# Patient Record
Sex: Female | Born: 1990 | Race: Black or African American | Hispanic: No | Marital: Single | Smoking: Former smoker
Health system: Southern US, Community
[De-identification: ages and names within clinical notes are randomized; demographics above are authoritative.]

---

## 2012-10-04 ENCOUNTER — Emergency Department: Payer: Self-pay | Admitting: Emergency Medicine

## 2012-11-16 ENCOUNTER — Emergency Department: Payer: Self-pay | Admitting: Emergency Medicine

## 2013-01-25 ENCOUNTER — Emergency Department: Payer: Self-pay | Admitting: Emergency Medicine

## 2014-11-26 ENCOUNTER — Emergency Department: Payer: Self-pay | Admitting: Emergency Medicine

## 2014-12-05 ENCOUNTER — Ambulatory Visit: Payer: Self-pay | Admitting: Gastroenterology

## 2015-01-19 LAB — SURGICAL PATHOLOGY

## 2016-08-23 IMAGING — CT CT ABD-PELV W/ CM
2 of 4 series · 17 of 46 positions shown, 19 images · IV contrast (omnipaque)
Comparison: None.

CLINICAL DATA: Abdominal pain and diarrhea over the last 3 months,
worsening recently. Central abdominal pain radiating to the back.
Left lower quadrant pain.

EXAM:
CT ABDOMEN AND PELVIS WITH CONTRAST
TECHNIQUE: Multidetector CT imaging of the abdomen and pelvis was performed
using the standard protocol following bolus administration of
intravenous contrast.
CONTRAST:  100 cc Omnipaque 300

[Series 2: routine abd pel with · axial · 0.62mm/px · z∈[-812,-397]mm · 14 of 91 slices shown, 16 images]
[im 4/91  soft-tissue]
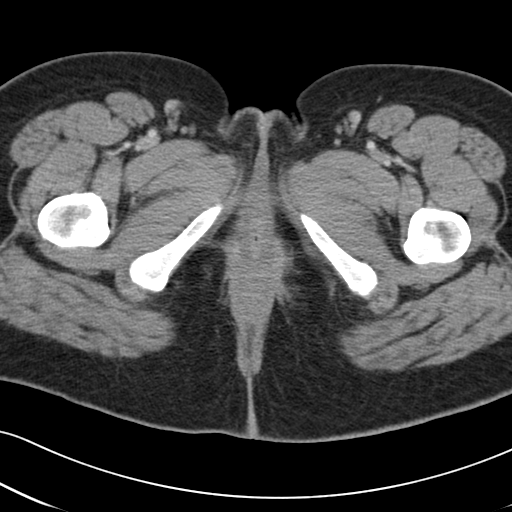
[im 4/91  bone]
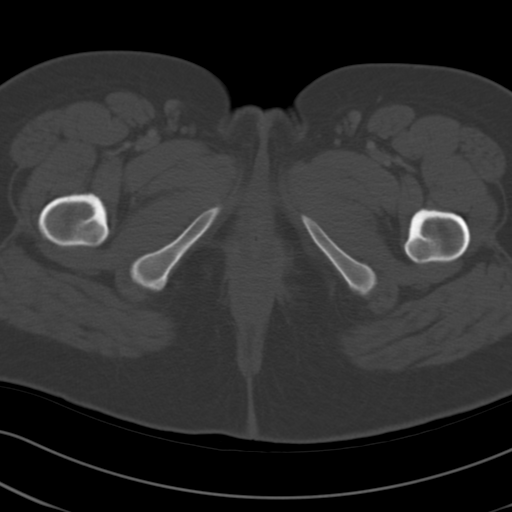
[im 12/91  soft-tissue]
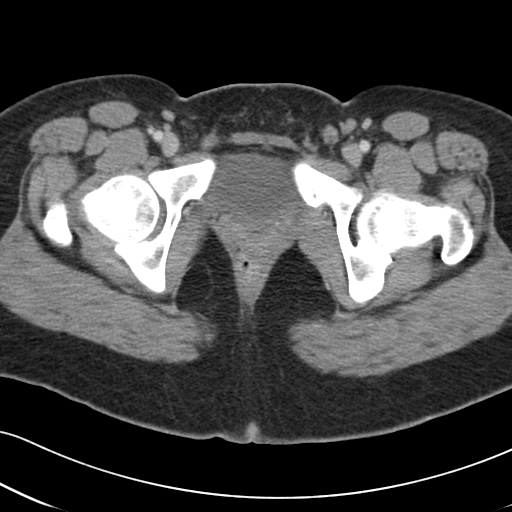
[im 19/91  soft-tissue]
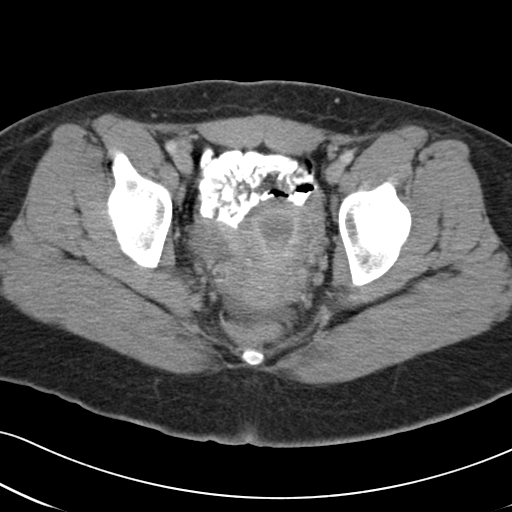
[im 23/91  soft-tissue]
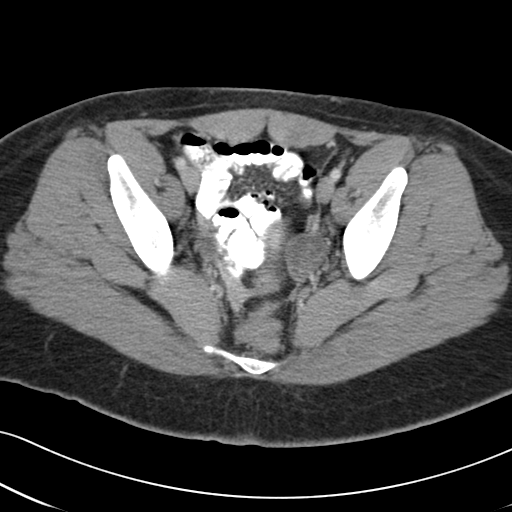
[im 31/91  soft-tissue]
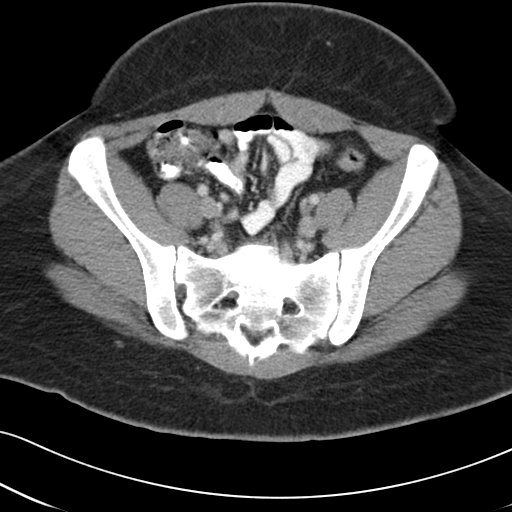
[im 38/91  soft-tissue]
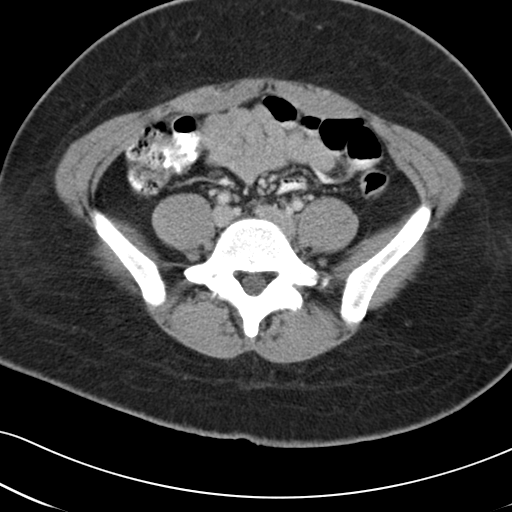
[im 42/91  soft-tissue]
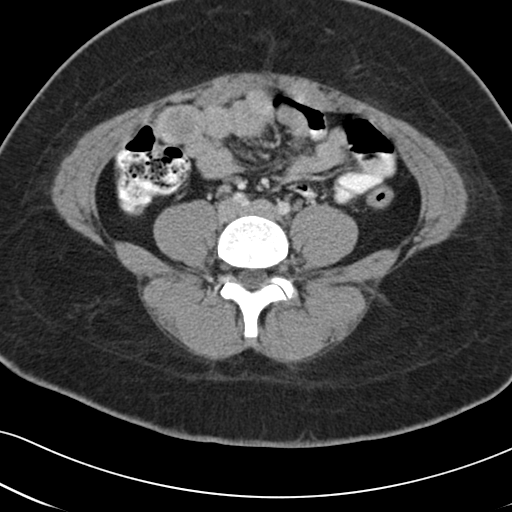
[im 49/91  soft-tissue]
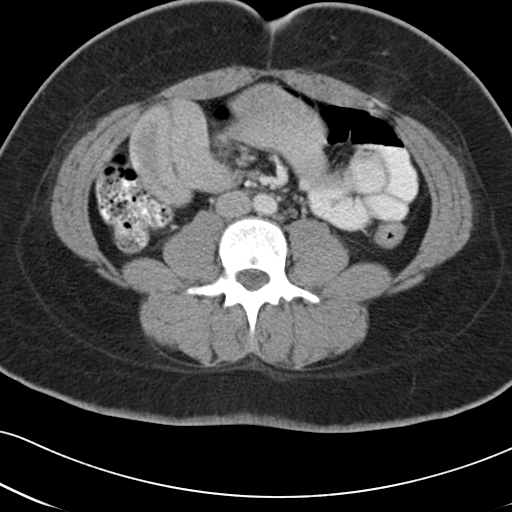
[im 53/91  soft-tissue]
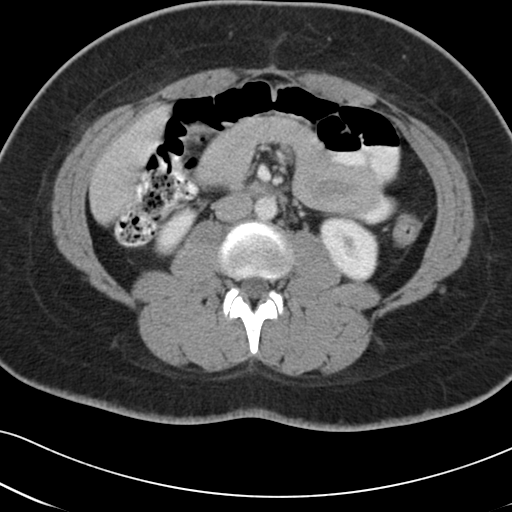
[im 53/91  bone]
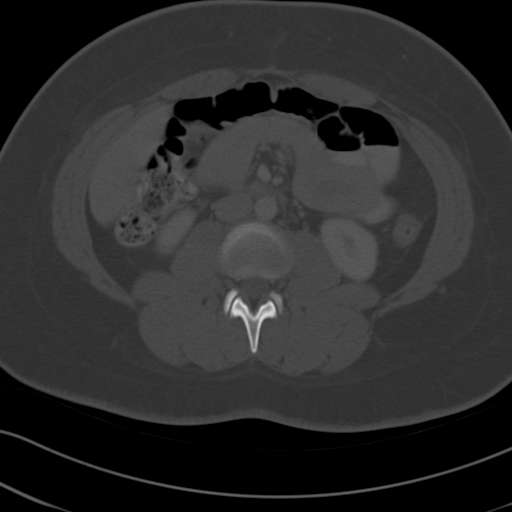
[im 61/91  soft-tissue]
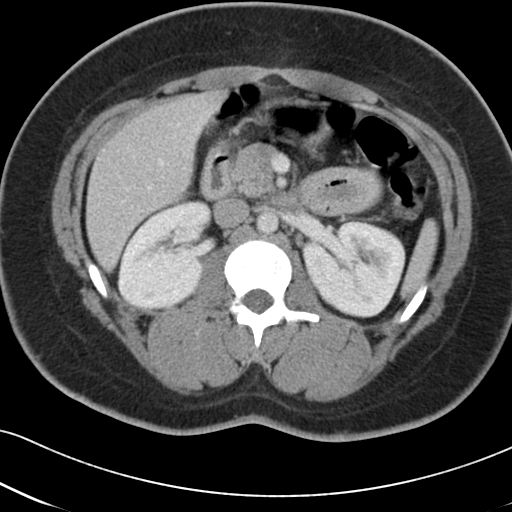
[im 68/91  soft-tissue]
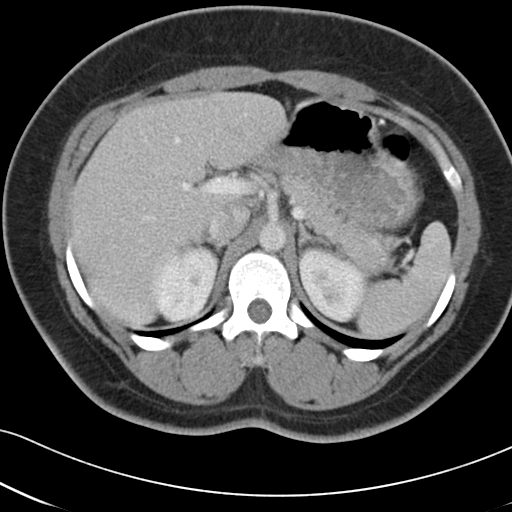
[im 72/91  soft-tissue]
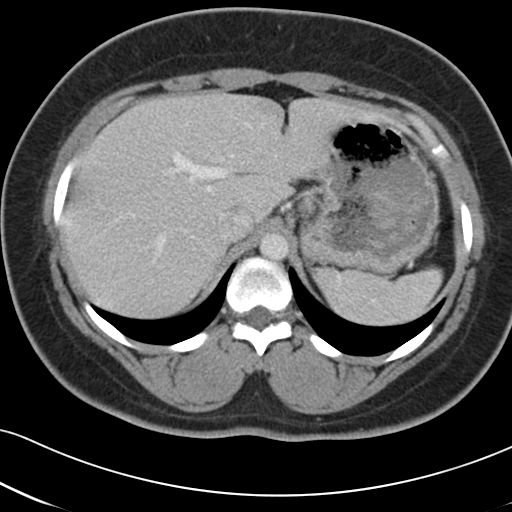
[im 79/91  soft-tissue]
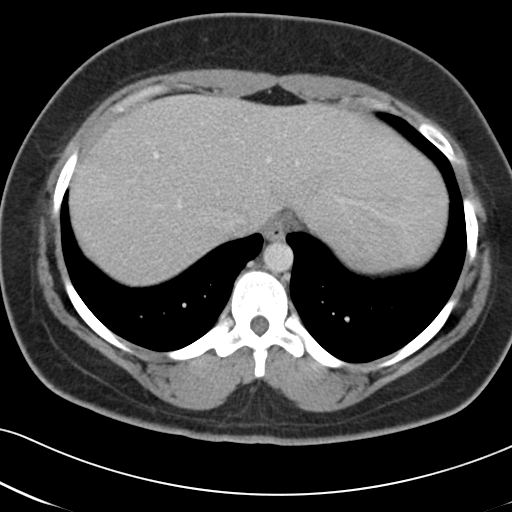
[im 87/91  soft-tissue]
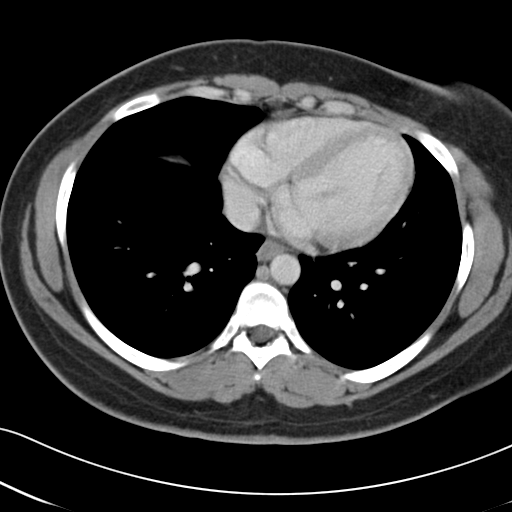

[Series 5: cor routine abd pel with · coronal · 0.89mm/px · 3 of 135 slices shown]
[im 45/135  soft-tissue]
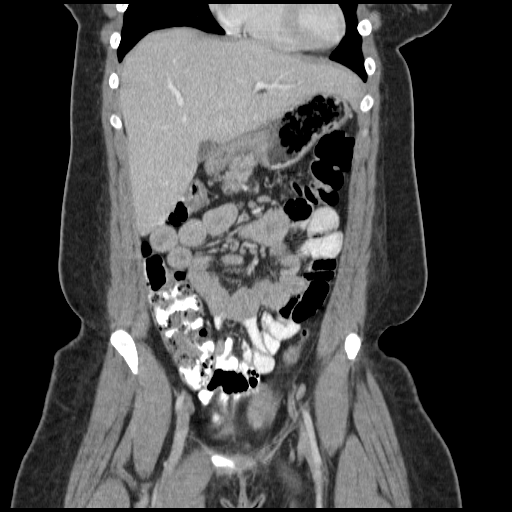
[im 60/135  soft-tissue]
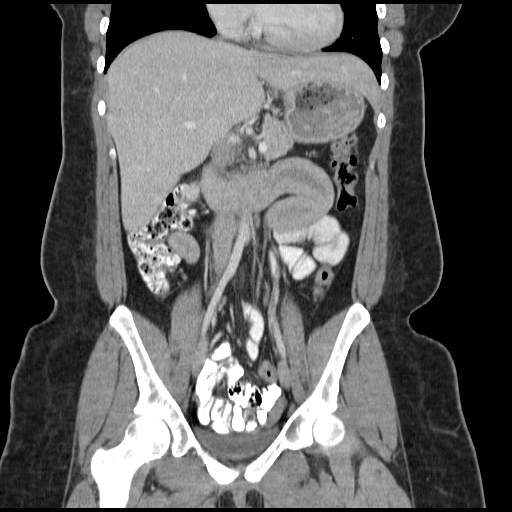
[im 75/135  soft-tissue]
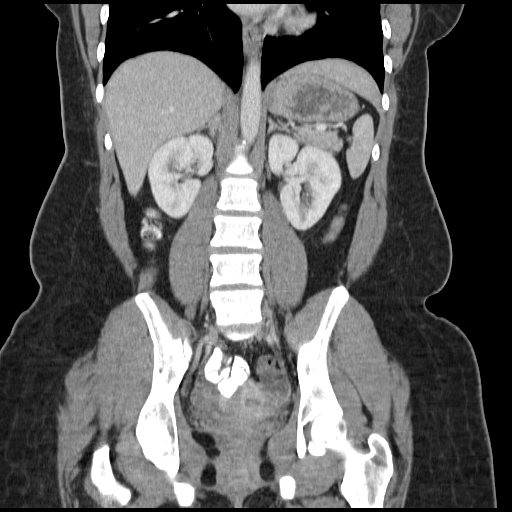

[17 of 46 positions shown; findings below may reference images not displayed]

FINDINGS: Lung bases are clear.  No pleural or pericardial fluid.

The liver has a normal appearance without focal lesions or biliary
ductal dilatation. No calcified gallstones. The spleen is normal.
The pancreas is normal. The adrenal glands are normal. The left
kidney is normal. The right kidney contains a 3 mm stone in the
midportion but there is no evidence of hydronephrosis or passing
stone. The aorta and IVC are normal. Uterus and adnexal regions are
normal. Tiny amount of free fluid, not significant. No osseous
abnormality.

There are abnormal thick walled loops of small intestine, primarily
jejunum. The L a.m. as a more normal appearance. The appendix is
normal. No abnormality seen of the colon. No free air.
IMPRESSION: Multiple loops of thick-walled jejunum. Most likely diagnosis given
this pattern is infectious enteritis. Inflammatory bowel disease
more typically affects the ileum. Celiac disease is a consideration
but felt unlikely.

## 2016-10-24 ENCOUNTER — Encounter: Payer: Self-pay | Admitting: Emergency Medicine

## 2016-10-24 ENCOUNTER — Emergency Department
Admission: EM | Admit: 2016-10-24 | Discharge: 2016-10-24 | Disposition: A | Discharge status: Home / Self Care | Payer: Self-pay | Attending: Emergency Medicine | Admitting: Emergency Medicine

## 2016-10-24 DIAGNOSIS — R300 Dysuria: Secondary | ICD-10-CM | POA: Insufficient documentation

## 2016-10-24 DIAGNOSIS — F17200 Nicotine dependence, unspecified, uncomplicated: Secondary | ICD-10-CM | POA: Insufficient documentation

## 2016-10-24 DIAGNOSIS — R350 Frequency of micturition: Secondary | ICD-10-CM | POA: Insufficient documentation

## 2016-10-24 DIAGNOSIS — Z202 Contact with and (suspected) exposure to infections with a predominantly sexual mode of transmission: Secondary | ICD-10-CM | POA: Insufficient documentation

## 2016-10-24 LAB — CHLAMYDIA/NGC RT PCR (ARMC ONLY)
Chlamydia Tr: NOT DETECTED
N gonorrhoeae: NOT DETECTED

## 2016-10-24 LAB — URINALYSIS, COMPLETE (UACMP) WITH MICROSCOPIC
BILIRUBIN URINE: NEGATIVE
Glucose, UA: NEGATIVE mg/dL
Hgb urine dipstick: NEGATIVE
Ketones, ur: NEGATIVE mg/dL
Leukocytes, UA: NEGATIVE
Nitrite: POSITIVE — AB
Protein, ur: NEGATIVE mg/dL
SPECIFIC GRAVITY, URINE: 1.014 (ref 1.005–1.030)
pH: 8 (ref 5.0–8.0)

## 2016-10-24 LAB — WET PREP, GENITAL
Sperm: NONE SEEN
Trich, Wet Prep: NONE SEEN
Yeast Wet Prep HPF POC: NONE SEEN

## 2016-10-24 LAB — POCT PREGNANCY, URINE: PREG TEST UR: NEGATIVE

## 2016-10-24 MED ORDER — CEFTRIAXONE SODIUM 250 MG IJ SOLR
250.0000 mg | Freq: Once | INTRAMUSCULAR | Status: AC
  Administered 2016-10-24: 250 mg via INTRAMUSCULAR
  Filled 2016-10-24: qty 250

## 2016-10-24 MED ORDER — AZITHROMYCIN 500 MG PO TABS
1000.0000 mg | ORAL_TABLET | Freq: Once | ORAL | Status: AC
  Administered 2016-10-24: 1000 mg via ORAL
  Filled 2016-10-24: qty 2

## 2016-10-24 MED ORDER — CEPHALEXIN 500 MG PO CAPS: 500.0000 mg | ORAL_CAPSULE | Freq: Four times a day (QID) | ORAL | 0 refills | Status: AC

## 2016-10-24 NOTE — ED Notes (Signed)
Urine sent. Yellow and cloudy

## 2016-10-24 NOTE — ED Triage Notes (Signed)
Pt reports had sex for the first time 10/12/16 then started having dysuria and vaginal pain after.  LMP 10/08/16.  Thinks has a UTI. Urine has small odor. Denies vaginal discharge. Pt reports did not use protection but the guy she had sex with told her he was clean (he is here with pt in lobby)".

## 2016-10-24 NOTE — ED Provider Notes (Signed)
[facility] Emergency Department Provider Note  ____________________________________________  Time seen: Approximately 1:13 PM  I have reviewed the triage vital signs and the nursing notes.   HISTORY  Chief Complaint Dysuria and vaginal pain    HPI Rose Stone is a 26 y.o. female presenting to the emergency department with increased urinary frequency and dysuria for the past week. Patient denies hematuria. Patient states that she recently engaged in sexual intercourse for the first time on 10/12/2016. Patient had unprotected sex and is concerned for sexually transmitted diseases. She denies changes in vaginal discharge or vaginal bleeding. She denies flank pain, history of cystitis, history of nephrolithiasis or history of pyelonephritis. She has been afebrile. LMP 10/08/2016. Patient has not attempted alleviating measures.   History reviewed. No pertinent past medical history.  There are no active problems to display for this patient.   History reviewed. No pertinent surgical history.  Prior to Admission medications   Medication Sig Start Date End Date Taking? Authorizing Provider  cephALEXin (KEFLEX) 500 MG capsule Take 1 capsule (500 mg total) by mouth 4 (four) times daily. 10/24/16 11/03/16  Orvil Feil, PA-C    Allergies Patient has no known allergies.  History reviewed. No pertinent family history.  Social History Social History  Substance Use Topics  . Smoking status: Current Every Day Smoker  . Smokeless tobacco: Never Used  . Alcohol use No    Review of Systems  Constitutional: No fever/chills Eyes: No visual changes. No discharge ENT: No upper respiratory complaints. Cardiovascular: no chest pain. Respiratory: no cough. No SOB. Gastrointestinal: No abdominal pain.  No nausea, no vomiting.  No diarrhea.  No constipation. Genitourinary: Patient has had dysuria and increased urinary frequency. No hematuria Musculoskeletal: Negative  for musculoskeletal pain. Skin: Negative for rash, abrasions, lacerations, ecchymosis. Neurological: Negative for headaches, focal weakness or numbness. ____________________________________________   PHYSICAL EXAM:  VITAL SIGNS: ED Triage Vitals  Enc Vitals Group     BP 10/24/16 1202 139/83     Pulse Rate 10/24/16 1202 72     Resp 10/24/16 1202 18     Temp 10/24/16 1202 98.6 F (37 C)     Temp src --      SpO2 10/24/16 1202 100 %     Weight 10/24/16 1200 165 lb (74.8 kg)     Height 10/24/16 1200 5\' 6"  (1.676 m)     Head Circumference --      Peak Flow --      Pain Score 10/24/16 1201 5     Pain Loc --      Pain Edu? --      Excl. in GC? --      Constitutional: Alert and oriented. Well appearing and in no acute distress. Eyes: Conjunctivae are normal. PERRL. EOMI. Head: Atraumatic. Neck: No stridor.  No cervical spine tenderness to palpation. Cardiovascular: Normal rate, regular rhythm. Normal S1 and S2.  Good peripheral circulation. Respiratory: Normal respiratory effort without tachypnea or retractions. Lungs CTAB. Good air entry to the bases with no decreased or absent breath sounds. Gastrointestinal: Bowel sounds 4 quadrants. Soft and nontender to palpation. No guarding or rigidity. No palpable masses. No distention. No CVA tenderness bilaterally.  Genitourinary: The skin overlying the labia majora is without erythema or signs of excoriation. Patient has no cervical motion tenderness with bimanual exam. Musculoskeletal: Full range of motion to all extremities. No gross deformities appreciated. Neurologic:  Normal speech and language. No gross focal neurologic deficits are appreciated.  Skin:  Skin is warm, dry and intact. No rash noted. Psychiatric: Mood and affect are normal. Speech and behavior are normal. Patient exhibits appropriate insight and judgement.    LABS (all labs ordered are listed, but only abnormal results are displayed)  Labs Reviewed  WET PREP,  GENITAL - Abnormal; Notable for the following:       Result Value   Clue Cells Wet Prep HPF POC PRESENT (*)    WBC, Wet Prep HPF POC FEW (*)    All other components within normal limits  URINALYSIS, COMPLETE (UACMP) WITH MICROSCOPIC - Abnormal; Notable for the following:    Color, Urine YELLOW (*)    APPearance CLOUDY (*)    Nitrite POSITIVE (*)    Bacteria, UA RARE (*)    Squamous Epithelial / LPF 0-5 (*)    All other components within normal limits  URINE CULTURE  CHLAMYDIA/NGC RT PCR ([facility] ONLY)  POC URINE PREG, ED  POCT PREGNANCY, URINE   ____________________________________________  EKG   ____________________________________________  RADIOLOGY  No results found.  ____________________________________________    PROCEDURES  Procedure(s) performed:    Procedures    Medications  cefTRIAXone (ROCEPHIN) injection 250 mg (250 mg Intramuscular Given 10/24/16 1409)  azithromycin (ZITHROMAX) tablet 1,000 mg (1,000 mg Oral Given 10/24/16 1408)     ____________________________________________   INITIAL IMPRESSION / ASSESSMENT AND PLAN / ED COURSE  Pertinent labs & imaging results that were available during my care of the patient were reviewed by me and considered in my medical decision making (see chart for details).  Review of the [place] CSRS was performed in accordance of the [facility] prior to dispensing any controlled drugs.     Assessment and Plan: Dysuria:  Patient presents to the emergency department with dysuria and increased urinary frequency. Urinalysis reveals nitrites and rare bacteria. Patient would like to be treated for gonorrhea and Chlamydia in the emergency department. Urine was sent for culture. Ceftriaxone and azithromycin were administered. Patient has been afebrile with no cervical motion tenderness elicited on pelvic exam. Patient was treated empirically with Keflex for suspected urinary tract infection. Patient education was provided  regarding safe sex practices. All patient questions were answered. ___________________________________________  FINAL CLINICAL IMPRESSION(S) / ED DIAGNOSES  Final diagnoses:  STD exposure  Dysuria  Increased urinary frequency      NEW MEDICATIONS STARTED DURING THIS VISIT:  Discharge Medication List as of 10/24/2016  1:52 PM    START taking these medications   Details  cephALEXin (KEFLEX) 500 MG capsule Take 1 capsule (500 mg total) by mouth 4 (four) times daily., Starting Mon 10/24/2016, Until Thu 11/03/2016, Print            This chart was dictated using voice recognition software/Dragon. Despite best efforts to proofread, errors can occur which can change the meaning. Any change was purely unintentional.    Orvil FeilJaclyn M Woods, PA-C 10/24/16 1558    Minna AntisKevin Paduchowski, MD 10/27/16 [phone]

## 2016-10-27 LAB — URINE CULTURE
Culture: >=100000 — AB
Special Requests: NORMAL

## 2016-10-28 NOTE — Progress Notes (Signed)
ED Antimicrobial Stewardship Positive Culture Follow Up   Rose Stone is an 26 y.o. female who [facility] with a chief complaint of  Chief Complaint  Patient presents with  . Dysuria  . vaginal pain    Recent Results (from the past 720 hour(s))  Urine culture     Status: Abnormal   Collection Time: 10/24/16  1:47 PM  Result Value Ref Range Status   Specimen Description URINE, CLEAN CATCH  Final   Special Requests Normal  Final   Culture >=100,000 COLONIES/mL ESCHERICHIA COLI (A)  Final   Report Status 10/27/2016 FINAL  Final   Organism ID, Bacteria ESCHERICHIA COLI (A)  Final      Susceptibility   Escherichia coli - MIC*    AMPICILLIN 4 SENSITIVE Sensitive     CEFAZOLIN <=4 SENSITIVE Sensitive     CEFTRIAXONE <=1 SENSITIVE Sensitive     CIPROFLOXACIN <=0.25 SENSITIVE Sensitive     GENTAMICIN <=1 SENSITIVE Sensitive     IMIPENEM <=0.25 SENSITIVE Sensitive     NITROFURANTOIN <=16 SENSITIVE Sensitive     TRIMETH/SULFA >=320 RESISTANT Resistant     AMPICILLIN/SULBACTAM <=2 SENSITIVE Sensitive     PIP/TAZO <=4 SENSITIVE Sensitive     Extended ESBL NEGATIVE Sensitive     * >=100,000 COLONIES/mL ESCHERICHIA COLI  Chlamydia/NGC rt PCR ([facility] only)     Status: None   Collection Time: 10/24/16  1:47 PM  Result Value Ref Range Status   Specimen source GC/Chlam ENDOCERVICAL  Final   Chlamydia Tr NOT DETECTED NOT DETECTED Final   N gonorrhoeae NOT DETECTED NOT DETECTED Final    Comment: (NOTE) 100  This methodology has not been evaluated in pregnant women or in 200  patients with a history of hysterectomy. 300 400  This methodology will not be performed on patients less than 6414  years of age.   Wet prep, genital     Status: Abnormal   Collection Time: 10/24/16  1:47 PM  Result Value Ref Range Status   Yeast Wet Prep HPF POC NONE SEEN NONE SEEN Final   Trich, Wet Prep NONE SEEN NONE SEEN Final   Clue Cells Wet Prep HPF POC PRESENT (A) NONE SEEN Final   WBC, Wet Prep HPF POC FEW (A) NONE SEEN Final   Sperm NONE SEEN  Final   Treated with appropriate antibiotic. Sent home on cephalexin.  Delsa BernKelly m Fuhrmann, PharmD 10/28/2016, 8:14 PM

## 2016-10-31 ENCOUNTER — Emergency Department
Admission: EM | Admit: 2016-10-31 | Discharge: 2016-10-31 | Disposition: A | Discharge status: Home / Self Care | Payer: Self-pay | Attending: Emergency Medicine | Admitting: Emergency Medicine

## 2016-10-31 ENCOUNTER — Encounter: Payer: Self-pay | Admitting: Emergency Medicine

## 2016-10-31 DIAGNOSIS — Z792 Long term (current) use of antibiotics: Secondary | ICD-10-CM | POA: Insufficient documentation

## 2016-10-31 DIAGNOSIS — F17200 Nicotine dependence, unspecified, uncomplicated: Secondary | ICD-10-CM | POA: Insufficient documentation

## 2016-10-31 DIAGNOSIS — R112 Nausea with vomiting, unspecified: Secondary | ICD-10-CM

## 2016-10-31 DIAGNOSIS — K529 Noninfective gastroenteritis and colitis, unspecified: Secondary | ICD-10-CM | POA: Insufficient documentation

## 2016-10-31 DIAGNOSIS — R197 Diarrhea, unspecified: Secondary | ICD-10-CM

## 2016-10-31 DIAGNOSIS — F1290 Cannabis use, unspecified, uncomplicated: Secondary | ICD-10-CM | POA: Insufficient documentation

## 2016-10-31 LAB — COMPREHENSIVE METABOLIC PANEL
ALBUMIN: 4.3 g/dL (ref 3.5–5.0)
ALT: 12 U/L — ABNORMAL LOW (ref 14–54)
AST: 19 U/L (ref 15–41)
Alkaline Phosphatase: 60 U/L (ref 38–126)
Anion gap: 7 (ref 5–15)
BUN: 11 mg/dL (ref 6–20)
CHLORIDE: 107 mmol/L (ref 101–111)
CO2: 24 mmol/L (ref 22–32)
Calcium: 9.4 mg/dL (ref 8.9–10.3)
Creatinine, Ser: 0.62 mg/dL (ref 0.44–1.00)
GFR calc Af Amer: >60 mL/min (ref >60)
GFR calc non Af Amer: >60 mL/min (ref >60)
GLUCOSE: 107 mg/dL — AB (ref 65–99)
POTASSIUM: 3.3 mmol/L — AB (ref 3.5–5.1)
SODIUM: 138 mmol/L (ref 135–145)
Total Bilirubin: 0.7 mg/dL (ref 0.3–1.2)
Total Protein: 7.6 g/dL (ref 6.5–8.1)

## 2016-10-31 LAB — CBC
HEMATOCRIT: 34.7 % — AB (ref 35.0–47.0)
Hemoglobin: 11.5 g/dL — ABNORMAL LOW (ref 12.0–16.0)
MCH: 28.4 pg (ref 26.0–34.0)
MCHC: 33.1 g/dL (ref 32.0–36.0)
MCV: 85.8 fL (ref 80.0–100.0)
Platelets: 331 10*3/uL (ref 150–440)
RBC: 4.04 MIL/uL (ref 3.80–5.20)
RDW: 14 % (ref 11.5–14.5)
WBC: 7.1 10*3/uL (ref 3.6–11.0)

## 2016-10-31 LAB — URINALYSIS, COMPLETE (UACMP) WITH MICROSCOPIC
BACTERIA UA: NONE SEEN
BILIRUBIN URINE: NEGATIVE
Glucose, UA: NEGATIVE mg/dL
Hgb urine dipstick: NEGATIVE
Ketones, ur: NEGATIVE mg/dL
LEUKOCYTES UA: NEGATIVE
Nitrite: NEGATIVE
PROTEIN: NEGATIVE mg/dL
SPECIFIC GRAVITY, URINE: 1.014 (ref 1.005–1.030)
pH: 6 (ref 5.0–8.0)

## 2016-10-31 LAB — POCT PREGNANCY, URINE: PREG TEST UR: NEGATIVE

## 2016-10-31 LAB — LIPASE, BLOOD: LIPASE: 25 U/L (ref 11–51)

## 2016-10-31 MED ORDER — LOPERAMIDE HCL 2 MG PO CAPS
2.0000 mg | ORAL_CAPSULE | Freq: Once | ORAL | Status: AC
  Administered 2016-10-31: 2 mg via ORAL

## 2016-10-31 MED ORDER — ONDANSETRON HCL 4 MG/2ML IJ SOLN: 4.0000 mg | Freq: Once | INTRAMUSCULAR | Status: DC

## 2016-10-31 MED ORDER — ONDANSETRON 4 MG PO TBDP: 4.0000 mg | ORAL_TABLET | Freq: Four times a day (QID) | ORAL | 0 refills | Status: DC | PRN

## 2016-10-31 MED ORDER — ONDANSETRON 4 MG PO TBDP
4.0000 mg | ORAL_TABLET | Freq: Once | ORAL | Status: AC
  Administered 2016-10-31: 4 mg via ORAL

## 2016-10-31 NOTE — ED Provider Notes (Signed)
[facility] Emergency Department Provider Note   ____________________________________________   First MD Initiated Contact with Patient 10/31/16 2221     (approximate)  I have reviewed the triage vital signs and the nursing notes.   HISTORY  Chief Complaint Diarrhea    HPI Rose Stone is a 26 y.o. female ports having started with nausea and vomiting for the last 3 days, feels like she has developed "stomach virus". Nausea with vomiting which she describes as food and water which has primarily gone away with some just mild nausea, but over the last day and a half is also had several loose stools. The last loose stool was about 2:00 this afternoon. No bloody stool. Reports no abdominal pain, but has had some slight crampy discomfort especially over the left side of the abdomen before having diarrhea.  Has missed work the last couple of days. Has not tried any medications, but reports is now able to eat and drink well. Denies pregnancy. No pain or burning with urination. No vaginal discharge. No fevers, no right-sided abdominal pain.   History reviewed. No pertinent past medical history.  There are no active problems to display for this patient.   History reviewed. No pertinent surgical history.  Prior to Admission medications   Medication Sig Start Date End Date Taking? Authorizing Provider  cephALEXin (KEFLEX) 500 MG capsule Take 1 capsule (500 mg total) by mouth 4 (four) times daily. 10/24/16 11/03/16  Orvil Feil, PA-C  ondansetron (ZOFRAN ODT) 4 MG disintegrating tablet Take 1 tablet (4 mg total) by mouth every 6 (six) hours as needed for nausea or vomiting. 10/31/16   Sharyn Creamer, MD    Allergies Patient has no known allergies.  No family history on file.  Social History Social History  Substance Use Topics  . Smoking status: Current Every Day Smoker  . Smokeless tobacco: Never Used  . Alcohol use No    Review of Systems Constitutional:  No fever/chills Eyes: No visual changes. ENT: No sore throat. Cardiovascular: Denies chest pain. Respiratory: Denies shortness of breath. Gastrointestinal: No abdominal pain.    No constipation. Genitourinary: Negative for dysuria. Denies pregnancy. Vaginal bleeding or discharge. No lower abdominal pain, in particular no right lower abdominal pain Musculoskeletal: Negative for back pain. Skin: Negative for rash. Neurological: Negative for headaches, focal weakness or numbness.  10-point ROS otherwise negative.  ____________________________________________   PHYSICAL EXAM:  VITAL SIGNS: ED Triage Vitals  Enc Vitals Group     BP 10/31/16 1932 126/67     Pulse Rate 10/31/16 1932 81     Resp 10/31/16 1932 18     Temp 10/31/16 1932 98.5 F (36.9 C)     Temp Source 10/31/16 1932 Oral     SpO2 10/31/16 1932 100 %     Weight 10/31/16 1934 160 lb (72.6 kg)     Height 10/31/16 1934 5\' 6"  (1.676 m)     Head Circumference --      Peak Flow --      Pain Score 10/31/16 1935 5     Pain Loc --      Pain Edu? --      Excl. in GC? --     Constitutional: Alert and oriented. Well appearing and in no acute distress.Very pleasant. Eyes: Conjunctivae are normal. PERRL. EOMI. Head: Atraumatic. Nose: No congestion/rhinnorhea. Mouth/Throat: Mucous membranes are moist.  Oropharynx non-erythematous. Neck: No stridor.   Cardiovascular: Normal rate, regular rhythm. Grossly normal heart sounds.  Good  peripheral circulation. Respiratory: Normal respiratory effort.  No retractions. Lungs CTAB. Gastrointestinal: Soft and nontender except for some minimal discomfort in the left upper quadrant palpation without rebound or guarding. Negative Murphy. No pain at McBurney's point. No distention. No abdominal bruits. Neg [place]. Musculoskeletal: No lower extremity tenderness nor edema.   Neurologic:  Normal speech and language. No gross focal neurologic deficits are appreciated. No gait instability. Skin:   Skin is warm, dry and intact. No rash noted. Psychiatric: Mood and affect are normal. Speech and behavior are normal.  ____________________________________________   LABS (all labs ordered are listed, but only abnormal results are displayed)  Labs Reviewed  COMPREHENSIVE METABOLIC PANEL - Abnormal; Notable for the following:       Result Value   Potassium 3.3 (*)    Glucose, Bld 107 (*)    ALT 12 (*)    All other components within normal limits  CBC - Abnormal; Notable for the following:    Hemoglobin 11.5 (*)    HCT 34.7 (*)    All other components within normal limits  URINALYSIS, COMPLETE (UACMP) WITH MICROSCOPIC - Abnormal; Notable for the following:    Color, Urine YELLOW (*)    APPearance CLEAR (*)    Squamous Epithelial / LPF 0-5 (*)    All other components within normal limits  LIPASE, BLOOD  POCT PREGNANCY, URINE   ____________________________________________  EKG   ____________________________________________  RADIOLOGY   ____________________________________________   PROCEDURES  Procedure(s) performed: None  Procedures  Critical Care performed: No  ____________________________________________   INITIAL IMPRESSION / ASSESSMENT AND PLAN / ED COURSE  Pertinent labs & imaging results that were available during my care of the patient were reviewed by me and considered in my medical decision making (see chart for details).  Patient is from evaluation of nausea vomiting diarrhea. Hemodynamically stable, reports her vomiting has improved and she is able take by mouth well. She is afebrile, reassuring lab work, and very reassuring clinical examination. I discussed with the patient, and believe the best option of treatment here is with antiemetic, and over-the-counter Imodium. She appears to have a likely viral self-limited illness, suspicious for gastroenteritis or nor a virus given Season. No influenza symptoms noted.  No evidence of an acute abdomen, no  pain to palpation in the right upper quadrant with reassuring LFTs. No leukocytosis, I find it extremely unlikely that she is suffering are developed appendicitis given her clinical examination and history. I discussed with the patient very careful return precautions, and she is agreeable to following up, and returning if worsening or persistent symptoms, developing a fever or other new concerns arise.  Return precautions and treatment recommendations and follow-up discussed with the patient who is agreeable with the plan.       ____________________________________________   FINAL CLINICAL IMPRESSION(S) / ED DIAGNOSES  Final diagnoses:  Nausea vomiting and diarrhea  Gastroenteritis      NEW MEDICATIONS STARTED DURING THIS VISIT:  New Prescriptions   ONDANSETRON (ZOFRAN ODT) 4 MG DISINTEGRATING TABLET    Take 1 tablet (4 mg total) by mouth every 6 (six) hours as needed for nausea or vomiting.     Note:  This document was prepared using Dragon voice recognition software and may include unintentional dictation errors.     Sharyn CreamerMark Danali Marinos, MD 10/31/16 [phone]

## 2016-10-31 NOTE — Discharge Instructions (Signed)
You were seen in the emergency room for vomiting and diarrhea. I suspect this is from a viral illness. It is important that you follow up closely with your primary care doctor in the next couple of days.  I would also suggest trying Imodium over-the-counter as directed on the packaging for the next couple days. This may help with your diarrhea.   Please return to the emergency room right away if you are to develop a fever, severe nausea, your pain becomes severe or worsens, you are unable to keep food down, begin vomiting any dark or bloody fluid, you develop any dark or bloody stools, feel dehydrated, or other new concerns or symptoms arise.

## 2016-10-31 NOTE — ED Triage Notes (Signed)
Pt ambulatory to triage with steady gait with c/o diarrhea x 3 days, pt states had vomiting episodes on Saturday and Sunday but reports today vomiting has subsided. Pt denies running fever at home, urinary symptoms, or chest pain. Pt also c/o mid lower abd pain. Pt alert and oriented x 4, respirations even and unlabored.

## 2016-10-31 NOTE — ED Notes (Signed)
Pt reports nausea and vomiting on Saturday and one episode of vomiting Sunday, pt reports since Saturday Diarrhea pt reports today she had 6 episodes of diarrhea denies any nausea or vomiting today. Pt reports only one sick at home.

## 2016-10-31 NOTE — ED Notes (Signed)
Pt verbalizes understanding of discharge instructions.

## 2016-10-31 NOTE — ED Notes (Signed)
Pt called X 3 times in lobby, no answer.

## 2016-10-31 NOTE — ED Notes (Signed)
Pt reports she has been eating light bland diet.

## 2019-04-22 ENCOUNTER — Emergency Department
Admission: EM | Admit: 2019-04-22 | Discharge: 2019-04-22 | Disposition: A | Discharge status: Home / Self Care | Payer: Self-pay | Attending: Emergency Medicine | Admitting: Emergency Medicine

## 2019-04-22 ENCOUNTER — Other Ambulatory Visit: Payer: Self-pay

## 2019-04-22 ENCOUNTER — Emergency Department: Payer: Self-pay

## 2019-04-22 DIAGNOSIS — Y9383 Activity, rough housing and horseplay: Secondary | ICD-10-CM | POA: Insufficient documentation

## 2019-04-22 DIAGNOSIS — S93601A Unspecified sprain of right foot, initial encounter: Secondary | ICD-10-CM | POA: Insufficient documentation

## 2019-04-22 DIAGNOSIS — X501XXA Overexertion from prolonged static or awkward postures, initial encounter: Secondary | ICD-10-CM | POA: Insufficient documentation

## 2019-04-22 DIAGNOSIS — Y929 Unspecified place or not applicable: Secondary | ICD-10-CM | POA: Insufficient documentation

## 2019-04-22 DIAGNOSIS — F17210 Nicotine dependence, cigarettes, uncomplicated: Secondary | ICD-10-CM | POA: Insufficient documentation

## 2019-04-22 DIAGNOSIS — Y999 Unspecified external cause status: Secondary | ICD-10-CM | POA: Insufficient documentation

## 2019-04-22 MED ORDER — NAPROXEN 500 MG PO TABS: 500.0000 mg | ORAL_TABLET | Freq: Two times a day (BID) | ORAL | 0 refills | Status: DC

## 2019-04-22 NOTE — ED Provider Notes (Signed)
[facility] Emergency Department Provider Note ____________________________________________  Time seen: Approximately 5:45 PM  I have reviewed the triage vital signs and the nursing notes.   HISTORY  Chief Complaint Foot Injury    HPI Rose Stone is a 28 y.o. 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 They fell to the ground and her foot got twisted. No pain with weight bearing, but does have pain when walking.  History reviewed. No pertinent past medical history.  There are no active problems to display for this patient.   History reviewed. No pertinent surgical history.  Prior to Admission medications   Medication Sig Start Date End Date Taking? Authorizing Provider  naproxen (NAPROSYN) 500 MG tablet Take 1 tablet (500 mg total) by mouth 2 (two) times daily with a meal. 04/22/19   Mikela Senn B, FNP    Allergies Patient has no known allergies.  No family history on file.  Social History Social History   Tobacco Use  . Smoking status: Current Every Day Smoker  . Smokeless tobacco: Never Used  Substance Use Topics  . Alcohol use: Yes  . Drug use: Yes    Types: Marijuana    Review of Systems Constitutional: Negative for fever. Cardiovascular: Negative for chest pain. Respiratory: Negative for shortness of breath. Musculoskeletal: Positive for foot pain. Skin: Negative for open wound or lesion.  Neurological: Negative for decrease in sensation  ____________________________________________   PHYSICAL EXAM:  VITAL SIGNS: ED Triage Vitals  Enc Vitals Group     BP 04/22/19 1645 120/69     Pulse Rate 04/22/19 1645 72     Resp 04/22/19 1645 18     Temp 04/22/19 1645 98.3 F (36.8 C)     Temp Source 04/22/19 1645 Oral     SpO2 04/22/19 1645 97 %     Weight 04/22/19 1646 153 lb (69.4 kg)     Height 04/22/19 1646 5'  6" (1.676 m)     Head Circumference --      Peak Flow --      Pain Score 04/22/19 1646 5     Pain Loc --      Pain Edu? --      Excl. in [place]? --     Constitutional: Alert and oriented. Well appearing and in no acute distress. Eyes: Conjunctivae are clear without discharge or drainage Head: Atraumatic Neck: Supple Respiratory: No cough. Respirations are even and unlabored. Musculoskeletal: Focal tenderness over the proximal 4th and 5th metatarsals with mild swelling. Neurologic: Motor and sensory function is intact.  Skin: No open wound or lesions. No contusions.  Psychiatric: Affect and behavior are appropriate.  ____________________________________________   LABS (all labs ordered are listed, but only abnormal results are displayed)  Labs Reviewed - No data to display ____________________________________________  RADIOLOGY  Normal right foot. ____________________________________________   PROCEDURES  Procedures  ____________________________________________   INITIAL IMPRESSION / ASSESSMENT AND PLAN / ED COURSE  Rose Stone is a 28 y.o. who presents to the emergency department for evaluation of right foot pain after injury last night.  X-ray is reassuring. ACE bandage applied and naprosyn sent to her pharmacy. She is to follow up with PCP if not improving over the week.  Medications - No data to display  Pertinent labs & imaging results that were available during my care of the patient were reviewed by me and considered in  my medical decision making (see chart for details).  _________________________________________   FINAL CLINICAL IMPRESSION(S) / ED DIAGNOSES  Final diagnoses:  Sprain of right foot, initial encounter    ED Discharge Orders         Ordered    naproxen (NAPROSYN) 500 MG tablet  2 times daily with meals     04/22/19 1743           If controlled substance prescribed during this visit, 12 month history viewed on the [place] prior to  issuing an initial prescription for Schedule II or III opiod.   Chinita Pesterriplett, Shevaun Lovan B, FNP 04/22/19 1749    Concha SeFunke, Mary E, MD 04/22/19 2035

## 2019-04-22 NOTE — Discharge Instructions (Signed)
Follow up with primary care provider if not improving over the next few days.  Return to the ER for symptoms that change or worsen if unable to schedule an appointment.

## 2019-04-22 NOTE — ED Triage Notes (Signed)
Pt states she injured her right foot last night horse playing with her brother.

## 2019-04-22 NOTE — ED Notes (Signed)
See triage note states she was playing around with her brother  He landed on right foot   Having pain to top of foot Ambulatory to room with slight limp

## 2020-09-23 ENCOUNTER — Encounter: Payer: Self-pay | Admitting: Obstetrics & Gynecology

## 2021-01-17 IMAGING — DX RIGHT FOOT COMPLETE - 3+ VIEW
3 series · 3 of 3 positions shown · non-contrast
Comparison: None.

CLINICAL DATA: Lateral foot pain today.  Twisting injury.

EXAM:
RIGHT FOOT COMPLETE - 3+ VIEW

[foot ap]
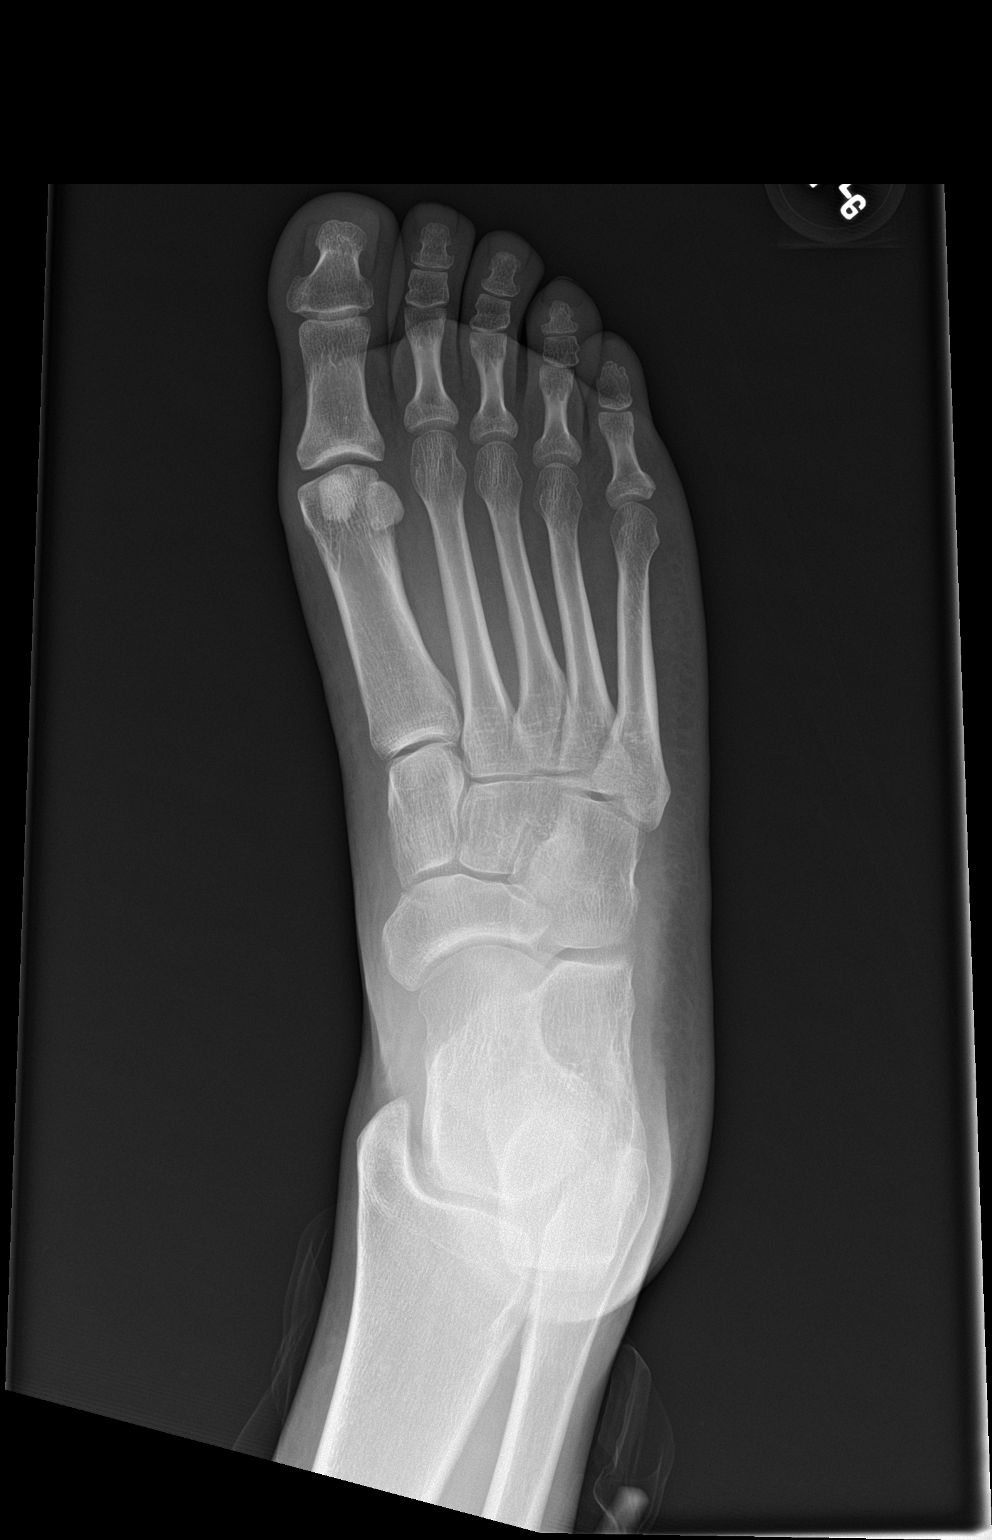

[foot obl]
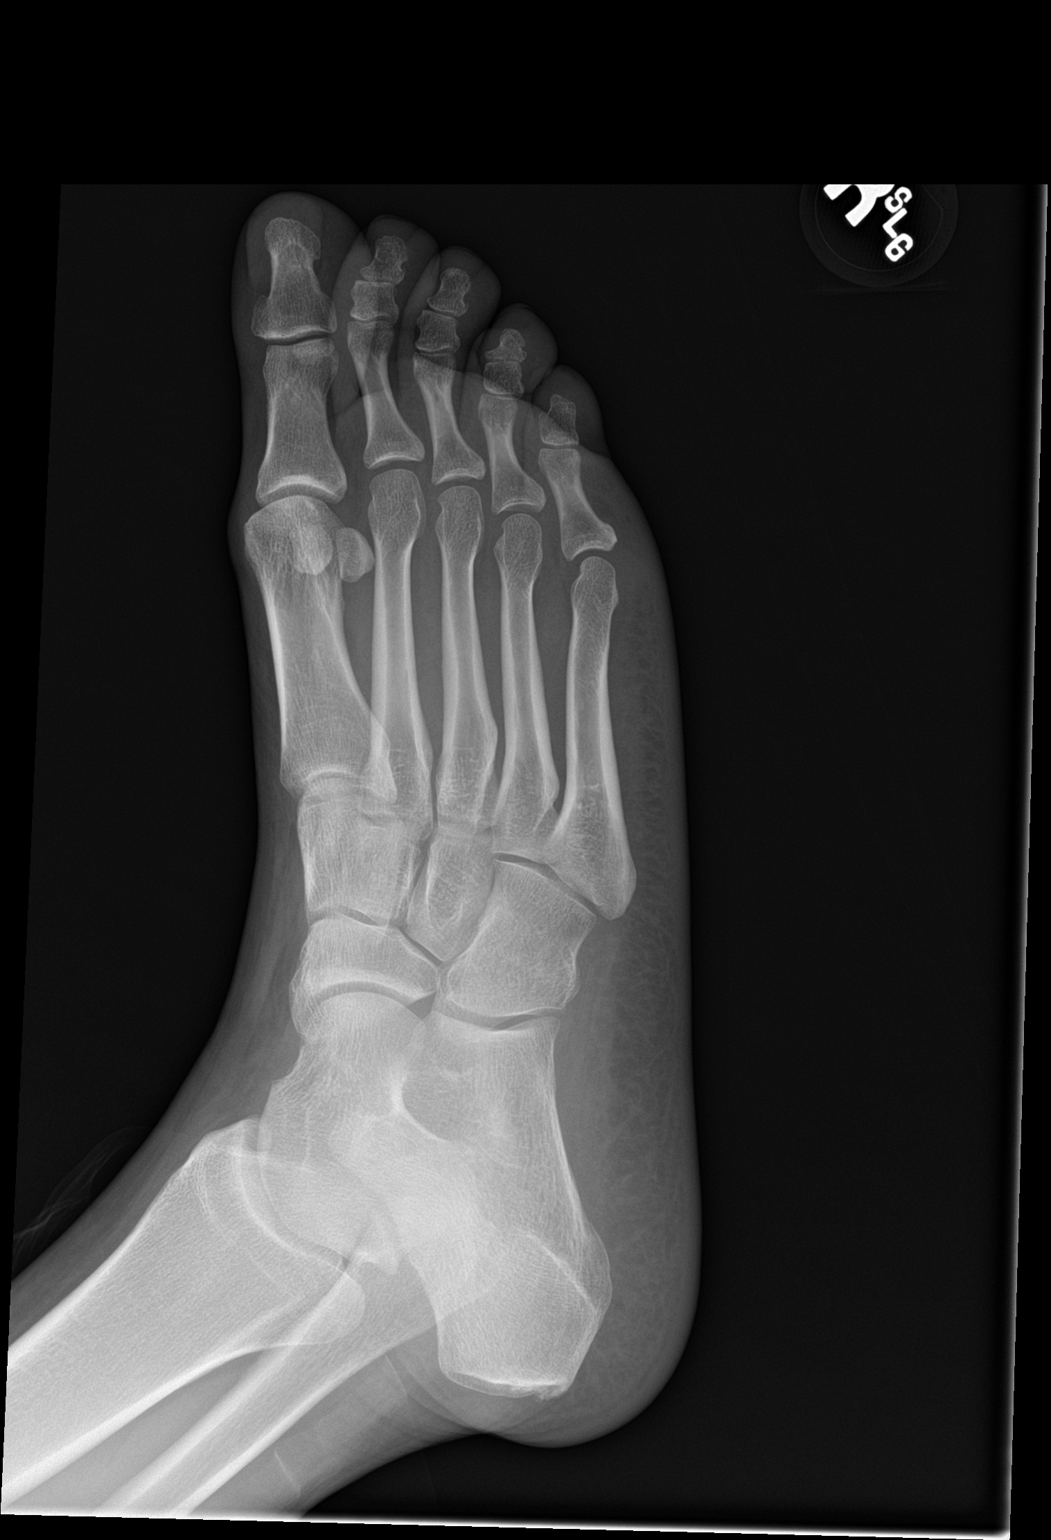

[foot lat]
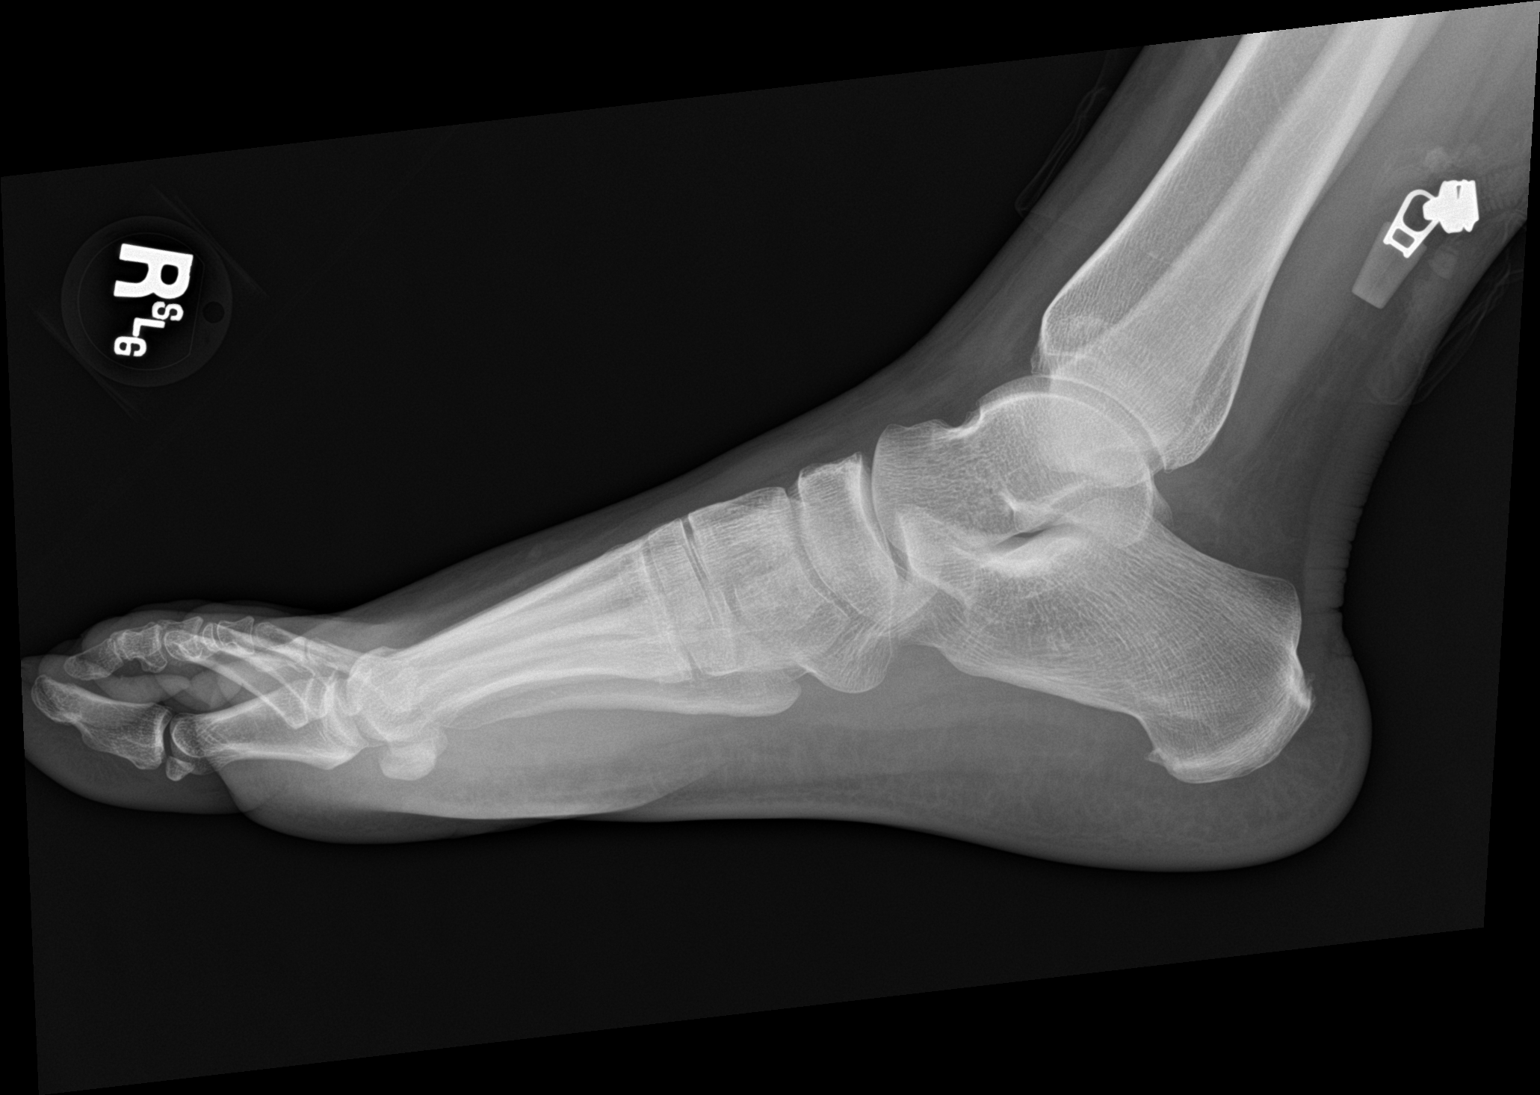

[3 of 3 positions shown; findings below may reference images not displayed]

FINDINGS: The mineralization and alignment are normal. There is no evidence of
acute fracture or dislocation. The joint spaces are preserved. No
focal soft tissue swelling or foreign bodies demonstrated.
IMPRESSION: Normal right foot radiographs.

## 2021-02-15 ENCOUNTER — Encounter: Payer: Self-pay | Admitting: Certified Nurse Midwife

## 2021-02-16 ENCOUNTER — Encounter: Payer: Self-pay | Admitting: Certified Nurse Midwife

## 2021-02-18 ENCOUNTER — Encounter: Payer: Self-pay | Admitting: Obstetrics and Gynecology

## 2021-03-17 ENCOUNTER — Encounter: Payer: Self-pay | Admitting: Obstetrics and Gynecology

## 2022-04-04 ENCOUNTER — Other Ambulatory Visit: Payer: Self-pay | Admitting: Obstetrics and Gynecology

## 2022-04-04 DIAGNOSIS — Z3141 Encounter for fertility testing: Secondary | ICD-10-CM

## 2022-04-12 ENCOUNTER — Other Ambulatory Visit: Payer: Self-pay | Admitting: Obstetrics and Gynecology

## 2022-04-12 DIAGNOSIS — Z3141 Encounter for fertility testing: Secondary | ICD-10-CM

## 2022-04-13 ENCOUNTER — Ambulatory Visit: Admission: RE | Admit: 2022-04-13 | Payer: BC Managed Care – PPO | Source: Ambulatory Visit

## 2022-09-12 ENCOUNTER — Ambulatory Visit
Admission: RE | Admit: 2022-09-12 | Discharge: 2022-09-12 | Disposition: A | Discharge status: Home / Self Care | Payer: BC Managed Care – PPO | Source: Ambulatory Visit | Attending: Obstetrics and Gynecology | Admitting: Obstetrics and Gynecology

## 2022-09-12 DIAGNOSIS — Z3141 Encounter for fertility testing: Secondary | ICD-10-CM

## 2023-09-05 ENCOUNTER — Emergency Department
Admission: EM | Admit: 2023-09-05 | Discharge: 2023-09-05 | Disposition: A | Discharge status: Home / Self Care | Payer: BC Managed Care – PPO | Attending: Emergency Medicine | Admitting: Emergency Medicine

## 2023-09-05 ENCOUNTER — Emergency Department: Payer: BC Managed Care – PPO

## 2023-09-05 ENCOUNTER — Other Ambulatory Visit: Payer: Self-pay

## 2023-09-05 DIAGNOSIS — M546 Pain in thoracic spine: Secondary | ICD-10-CM | POA: Diagnosis present

## 2023-09-05 DIAGNOSIS — R0602 Shortness of breath: Secondary | ICD-10-CM

## 2023-09-05 DIAGNOSIS — M62830 Muscle spasm of back: Secondary | ICD-10-CM | POA: Diagnosis not present

## 2023-09-05 MED ORDER — ACETAMINOPHEN 325 MG PO TABS
650.0000 mg | ORAL_TABLET | Freq: Once | ORAL | Status: AC
  Administered 2023-09-05: 650 mg via ORAL
  Filled 2023-09-05: qty 2

## 2023-09-05 MED ORDER — LIDOCAINE 5 % EX PTCH
1.0000 | MEDICATED_PATCH | CUTANEOUS | Status: DC
  Administered 2023-09-05: 1 via TRANSDERMAL
  Filled 2023-09-05: qty 1

## 2023-09-05 MED ORDER — CYCLOBENZAPRINE HCL 5 MG PO TABS: 5.0000 mg | ORAL_TABLET | Freq: Three times a day (TID) | ORAL | 0 refills | Status: DC | PRN

## 2023-09-05 MED ORDER — CYCLOBENZAPRINE HCL 10 MG PO TABS
5.0000 mg | ORAL_TABLET | Freq: Once | ORAL | Status: AC
  Administered 2023-09-05: 5 mg via ORAL
  Filled 2023-09-05: qty 1

## 2023-09-05 MED ORDER — KETOROLAC TROMETHAMINE 30 MG/ML IJ SOLN
30.0000 mg | Freq: Once | INTRAMUSCULAR | Status: AC
  Administered 2023-09-05: 30 mg via INTRAMUSCULAR
  Filled 2023-09-05: qty 1

## 2023-09-05 NOTE — Discharge Instructions (Addendum)
Your evaluated in the ED for back pain.  Your exam is reassuring.  Your chest x-ray is normal.  Please pick up prescriptions from your pharmacy.  Stay hydrated and get plenty of rest.

## 2023-09-05 NOTE — ED Provider Notes (Signed)
[facility] Emergency Department Provider Note     Event Date/Time   First MD Initiated Contact with Patient 09/05/23 2103     (approximate)   History   Back Pain   HPI  Rose Stone is a 32 y.o. female presents to the ED for evaluation of upper back pain that started this morning when she woke up.  Localized to her left side.  Patient reports worse with movement.  No trauma or injury.  Patient also notes that she has been around her father who diagnosed with pneumonia and is complaining of shortness of breath and pain with deep breaths at times.  Denies fever, cough and vomiting.     Physical Exam   Triage Vital Signs: ED Triage Vitals  Encounter Vitals Group     BP 09/05/23 1924 (!) 146/90     Systolic BP Percentile --      Diastolic BP Percentile --      Pulse Rate 09/05/23 1924 69     Resp 09/05/23 1924 18     Temp 09/05/23 1921 98.3 F (36.8 C)     Temp Source 09/05/23 1921 Oral     SpO2 09/05/23 1924 95 %     Weight 09/05/23 1923 176 lb (79.8 kg)     Height 09/05/23 1923 5\' 6"  (1.676 m)     Head Circumference --      Peak Flow --      Pain Score 09/05/23 1922 8     Pain Loc --      Pain Education --      Exclude from Growth Chart --     Most recent vital signs: Vitals:   09/05/23 1921 09/05/23 1924  BP:  (!) 146/90  Pulse:  69  Resp:  18  Temp: 98.3 F (36.8 C) 98.5 F (36.9 C)  SpO2:  95%    General: Well appearing. Alert and oriented. INAD Head:  NCAT.  CV:  Good peripheral perfusion. RRR. No peripheral edema.  RESP:  Normal effort. LCTAB.  BACK:  Spinous process is midline without deformity or tenderness.  Left-sided latissimus dorsi tenderness. MSK:   Full ROM in all joints. No swelling, deformity or tenderness.  NEURO: Cranial nerves II-XII intact.    ED Results / Procedures / Treatments   Labs (all labs ordered are listed, but only abnormal results are displayed) Labs Reviewed - No data to  display RADIOLOGY  I personally viewed and evaluated these images as part of my medical decision making, as well as reviewing the written report by the radiologist.  ED Provider Interpretation: Chest x-ray is normal.  DG Chest 1 View  Result Date: 09/05/2023 CLINICAL DATA:  Shortness of breath and upper back pain EXAM: CHEST  1 VIEW COMPARISON:  None Available. FINDINGS: The heart size and mediastinal contours are within normal limits. Both lungs are clear. The visualized skeletal structures are unremarkable. IMPRESSION: No evidence of acute chest disease. Electronically Signed   By: Almira Bar M.D.   On: 09/05/2023 21:54    PROCEDURES:  Critical Care performed: No  Procedures   MEDICATIONS ORDERED IN ED: Medications  lidocaine (LIDODERM) 5 % 1 patch (1 patch Transdermal Patch Applied 09/05/23 2258)  ketorolac (TORADOL) 30 MG/ML injection 30 mg (30 mg Intramuscular Given 09/05/23 2259)  acetaminophen (TYLENOL) tablet 650 mg (650 mg Oral Given 09/05/23 2256)  cyclobenzaprine (FLEXERIL) tablet 5 mg (5 mg Oral Given 09/05/23 2255)     IMPRESSION / MDM /  ASSESSMENT AND PLAN / ED COURSE  I reviewed the triage vital signs and the nursing notes.                               32 y.o. female presents to the emergency department for evaluation and treatment of shortness of breath and back pain. See HPI for further details.   Differential diagnosis includes, but is not limited to pneumonia, muscle spasm, muscle strain  Patient's presentation is most consistent with acute complicated illness / injury requiring diagnostic workup.  He is alert and oriented.  Hemodynamically stable.  Physical exam findings are benign.  Pertinent for left-sided muscle tenderness of the thoracic region.  Given that the patient has a sick contact with diagnosed pneumonia and feeling shortness of breath obtained a chest x-ray that is reassuring.  ED pain management with Toradol, Tylenol and a muscle relaxant  with a lidocaine patch. patient is in stable condition for discharge home.   FINAL CLINICAL IMPRESSION(S) / ED DIAGNOSES   Final diagnoses:  Muscle spasm of back    Rx / DC Orders   ED Discharge Orders          Ordered    cyclobenzaprine (FLEXERIL) 5 MG tablet  3 times daily PRN        09/05/23 2307             Note:  This document was prepared using Dragon voice recognition software and may include unintentional dictation errors.    Romeo Apple, Kahlea Cobert A, PA-C 09/05/23 2315    Concha Se, MD 09/07/23 [phone]

## 2023-09-05 NOTE — ED Triage Notes (Signed)
Pt reports sharp back pain in her upper back that began this morning, pt states pain is worse with movement.

## 2023-09-25 ENCOUNTER — Other Ambulatory Visit: Payer: Self-pay | Admitting: Obstetrics and Gynecology

## 2023-09-25 DIAGNOSIS — Z3183 Encounter for assisted reproductive fertility procedure cycle: Secondary | ICD-10-CM

## 2023-10-30 ENCOUNTER — Other Ambulatory Visit: Payer: Self-pay | Admitting: Obstetrics and Gynecology

## 2023-10-30 ENCOUNTER — Ambulatory Visit (HOSPITAL_BASED_OUTPATIENT_CLINIC_OR_DEPARTMENT_OTHER)
Admission: RE | Admit: 2023-10-30 | Discharge: 2023-10-30 | Disposition: A | Discharge status: Home / Self Care | Payer: BC Managed Care – PPO | Source: Ambulatory Visit | Attending: Obstetrics and Gynecology | Admitting: Obstetrics and Gynecology

## 2023-10-30 DIAGNOSIS — Z3183 Encounter for assisted reproductive fertility procedure cycle: Secondary | ICD-10-CM

## 2024-06-05 ENCOUNTER — Ambulatory Visit: Payer: Self-pay

## 2024-06-05 NOTE — Telephone Encounter (Signed)
 Copied from CRM [id number]. Topic: Clinical - Red Word Triage >> Jun 05, 2024  2:45 PM Rose Stone wrote: Red Word that prompted transfer to Nurse Triage: Pt needs to establish care at [facility]. Pt is reporting pain in both shoulders and knees. Reason for Disposition  [1] MODERATE pain (e.g., interferes with normal activities) AND [2] present > 3 days  Answer Assessment - Initial Assessment Questions Pt went to [facility]/ED for same concern. Pt was given muscle relaxer. Pt called to set up new PCP appt. Appt was made for 10/30 with [facility]. RN advised pt to seek care at [facility] until established. Pt verbalized understanding.      1. ONSET: When did the pain start?     Couple months 2. LOCATION: Where is the pain located?     Both shoulders 3. PAIN: How bad is the pain? (Scale 1-10; or mild, moderate, severe)     Currently-4; worst-9 4. WORK OR EXERCISE: Has there been any recent work or exercise that involved this part of the body?     Lifting boxes for work 5. CAUSE: What do you think is causing the shoulder pain?     Lifting boxes 6. OTHER SYMPTOMS: Do you have any other symptoms? (e.g., neck pain, swelling, rash, fever, numbness, weakness)     Knee pain  Protocols used: Shoulder Pain-A-AH

## 2024-06-21 ENCOUNTER — Other Ambulatory Visit: Payer: Self-pay

## 2024-06-21 ENCOUNTER — Emergency Department
Admission: EM | Admit: 2024-06-21 | Discharge: 2024-06-22 | Discharge status: Against Medical Advice | Attending: Emergency Medicine | Admitting: Emergency Medicine

## 2024-06-21 DIAGNOSIS — Z5321 Procedure and treatment not carried out due to patient leaving prior to being seen by health care provider: Secondary | ICD-10-CM | POA: Insufficient documentation

## 2024-06-21 DIAGNOSIS — M79604 Pain in right leg: Secondary | ICD-10-CM | POA: Insufficient documentation

## 2024-06-21 NOTE — ED Triage Notes (Signed)
 POV from home for non-traumatic right leg pain that starts in the patient right lower back and radiates down the leg. Patient states it has been going on for 2.5 weeks and is now getting worse.

## 2024-07-23 ENCOUNTER — Encounter: Payer: Self-pay | Admitting: Nurse Practitioner

## 2024-07-23 ENCOUNTER — Ambulatory Visit: Payer: Self-pay | Admitting: Nurse Practitioner

## 2024-07-23 VITALS — BP 125/49 | HR 78 | Wt 173.6 lb

## 2024-07-23 DIAGNOSIS — G8929 Other chronic pain: Secondary | ICD-10-CM | POA: Insufficient documentation

## 2024-07-23 DIAGNOSIS — Z0000 Encounter for general adult medical examination without abnormal findings: Secondary | ICD-10-CM | POA: Diagnosis not present

## 2024-07-23 DIAGNOSIS — F1290 Cannabis use, unspecified, uncomplicated: Secondary | ICD-10-CM | POA: Insufficient documentation

## 2024-07-23 DIAGNOSIS — M545 Low back pain, unspecified: Secondary | ICD-10-CM | POA: Diagnosis not present

## 2024-07-23 MED ORDER — IBUPROFEN 600 MG PO TABS: 600.0000 mg | ORAL_TABLET | Freq: Three times a day (TID) | ORAL | 0 refills | Status: DC | PRN

## 2024-07-23 MED ORDER — CYCLOBENZAPRINE HCL 5 MG PO TABS: 5.0000 mg | ORAL_TABLET | Freq: Three times a day (TID) | ORAL | 0 refills | Status: DC | PRN

## 2024-07-23 NOTE — Assessment & Plan Note (Signed)
 Low back pain Chronic low back pain likely due to occupational activities. Pain is aching, exacerbated by activity, relieved by rest, with occasional knee radiation. Previous prednisone ineffective. Variable relief with ibuprofen and Tylenol . No prior imaging. - Order lumbar spine X-ray to evaluate for degenerative changes. - Discussed refer to physical therapy for back pain management but she will like to think about this first - Advise use of heating pad for symptomatic relief. - Recommend alternating Tylenol  650 mg every six hours with ibuprofen 600 mg every eight hours as needed for pain, ensuring ibuprofen is taken with food. Flexeril  5 mg 3 times daily as needed ordered, discussed potential side effects of medication - Discuss potential side effects of ibuprofen, including risk of kidney disease and stomach ulcers with excessive use. - Encourage consideration of job modification or transfer to reduce physical strain. - Consider HR accommodations for additional breaks or reduced hours.   SABRA

## 2024-07-23 NOTE — Assessment & Plan Note (Signed)
  Current cannabis use. - Advise against use due to potential health risks.

## 2024-07-23 NOTE — Progress Notes (Signed)
 New Patient Office Visit  Subjective:  Patient ID: Rose Stone, female    DOB: 15-Nov-1990  Age: 33 y.o. MRN: [id number]  CC:  Chief Complaint  Patient presents with   Establish Care    Sciatica on the right, pain located in gthe right hip and radiates down to the right leg and now toes causing numbness   Headache    Headaches are becoming more frequent, having to take more medication to get rest   Medication Management    Was given muscle relaxer and prednisone but have become ineffective which were given for the hip and leg pain    paperwork    Need paperwork filled out regarding being put on light duty due to pain     HPI   Discussed the use of AI scribe software for clinical note transcription with the patient, who gave verbal consent to proceed.  History of Present Illness Rose Stone is a 33 year old female .pmh who presents with lower back pain. She is accompanied by her partner.  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 The pain started after waking up one day following her work shift and has persisted since then.  The pain was initially sharp and is now more of an ache. She reports that sitting for a long time or working for several hours brings on the pain. It is localized to the lower back and does not radiate, although it can become sharp and shoot down to her knee if she sits for a long time.  She visited the ER due to the severity of the pain and was prescribed prednisone, which did not provide relief. Currently, she takes ibuprofen and Tylenol  as needed, but is unsure of their effectiveness. She uses a back brace at work, which does not alleviate the pain.  She has not had any imaging studies like an x-ray done for her back pain. Her employer has offered accommodations such as additional breaks or reduced  hours, but she is concerned about the financial implications of not working full hours.  She lives with her wife and son. She smokes marijuana but does not consume alcohol or cigarettes. She previously smoked Black & Mild cigars about four to five years ago. No significant family medical history is reported.    Assessment & Plan      History reviewed. No pertinent past medical history.  History reviewed. No pertinent surgical history.  Family History  Problem Relation Age of Onset   Hypertension Maternal Grandmother     Social History   Socioeconomic History   Marital status: Single    Spouse name: Not on file   Number of children: Not on file   Years of education: Not on file   Highest education level: Not on file  Occupational History   Not on file  Tobacco Use   Smoking status: Former    Types: Cigarettes   Smokeless tobacco: Never  Vaping Use   Vaping status: Never Used  Substance and Sexual Activity   Alcohol use: Not Currently   Drug use: Yes    Types: Marijuana   Sexual activity: Yes  Other Topics Concern   Not on file  Social History Narrative   Lives with her partner and her son   Social Drivers of Corporate Investment Banker Strain:  Not on file  Food Insecurity: Not on file  Transportation Needs: Not on file  Physical Activity: Not on file  Stress: Not on file  Social Connections: Not on file  Intimate Partner Violence: Not At Risk (06/22/2024)   Received from [facility]   HITS    Over the last 12 months how often did your partner physically hurt you?: Never    Over the last 12 months how often did your partner insult you or talk down to you?: Never    Over the last 12 months how often did your partner threaten you with physical harm?: Never    Over the last 12 months how often did your partner scream or curse at you?: Never    ROS Review of Systems  Constitutional:  Negative for appetite change, chills, fatigue and fever.  HENT:  Negative  for congestion, postnasal drip, rhinorrhea and sneezing.   Respiratory:  Negative for cough, shortness of breath and wheezing.   Cardiovascular:  Negative for chest pain, palpitations and leg swelling.  Gastrointestinal:  Negative for abdominal pain, constipation, nausea and vomiting.  Genitourinary:  Negative for difficulty urinating, dysuria, flank pain and frequency.  Musculoskeletal:  Positive for back pain. Negative for arthralgias, joint swelling and myalgias.  Skin:  Negative for color change, pallor, rash and wound.  Neurological:  Negative for dizziness, facial asymmetry, weakness, numbness and headaches.  Psychiatric/Behavioral:  Negative for behavioral problems, confusion, self-injury and suicidal ideas.     Objective:   Today's Vitals: BP (!) 125/49 (BP Location: Left Arm, Patient Position: Sitting, Cuff Size: Large)   Pulse 78   Wt 173 lb 9.6 oz (78.7 kg)   SpO2 99%   BMI 28.02 kg/m   Physical Exam Vitals and nursing note reviewed.  Constitutional:      General: She is not in acute distress.    Appearance: Normal appearance. She is not ill-appearing, toxic-appearing or diaphoretic.  HENT:     Mouth/Throat:     Mouth: Mucous membranes are moist.     Pharynx: Oropharynx is clear. No oropharyngeal exudate or posterior oropharyngeal erythema.  Eyes:     General: No scleral icterus.       Right eye: No discharge.        Left eye: No discharge.     Extraocular Movements: Extraocular movements intact.     Conjunctiva/sclera: Conjunctivae normal.  Cardiovascular:     Rate and Rhythm: Normal rate and regular rhythm.     Pulses: Normal pulses.     Heart sounds: Normal heart sounds. No murmur heard.    No friction rub. No gallop.  Pulmonary:     Effort: Pulmonary effort is normal. No respiratory distress.     Breath sounds: Normal breath sounds. No stridor. No wheezing, rhonchi or rales.  Chest:     Chest wall: No tenderness.  Abdominal:     General: There is no  distension.     Palpations: Abdomen is soft.     Tenderness: There is no abdominal tenderness. There is no right CVA tenderness, left CVA tenderness or guarding.  Musculoskeletal:        General: No swelling, tenderness, deformity or signs of injury.     Right lower leg: No edema.     Left lower leg: No edema.     Comments: No tenderness on examination today  Skin:    General: Skin is warm and dry.     Capillary Refill: Capillary refill takes less than 2  seconds.     Coloration: Skin is not jaundiced or pale.     Findings: No bruising, erythema or lesion.  Neurological:     Mental Status: She is alert and oriented to person, place, and time.     Motor: No weakness.     Coordination: Coordination normal.     Gait: Gait normal.  Psychiatric:        Mood and Affect: Mood normal.        Behavior: Behavior normal.        Thought Content: Thought content normal.        Judgment: Judgment normal.     Assessment & Plan:   Problem List Items Addressed This Visit       Other   Chronic bilateral low back pain without sciatica - Primary   Low back pain Chronic low back pain likely due to occupational activities. Pain is aching, exacerbated by activity, relieved by rest, with occasional knee radiation. Previous prednisone ineffective. Variable relief with ibuprofen and Tylenol . No prior imaging. - Order lumbar spine X-ray to evaluate for degenerative changes. - Discussed refer to physical therapy for back pain management but she will like to think about this first - Advise use of heating pad for symptomatic relief. - Recommend alternating Tylenol  650 mg every six hours with ibuprofen 600 mg every eight hours as needed for pain, ensuring ibuprofen is taken with food. Flexeril  5 mg 3 times daily as needed ordered, discussed potential side effects of medication - Discuss potential side effects of ibuprofen, including risk of kidney disease and stomach ulcers with excessive use. - Encourage  consideration of job modification or transfer to reduce physical strain. - Consider HR accommodations for additional breaks or reduced hours.   .      Relevant Medications   ibuprofen (ADVIL) 600 MG tablet   cyclobenzaprine  (FLEXERIL ) 5 MG tablet   Other Relevant Orders   DG Lumbar Spine Complete   Cannabis use disorder    Current cannabis use. - Advise against use due to potential health risks.      Other Visit Diagnoses       Health care maintenance       Relevant Orders   CMP14+EGFR       Outpatient Encounter Medications as of 07/23/2024  Medication Sig   ibuprofen (ADVIL) 600 MG tablet Take 1 tablet (600 mg total) by mouth every 8 (eight) hours as needed.   cyclobenzaprine  (FLEXERIL ) 5 MG tablet Take 1 tablet (5 mg total) by mouth 3 (three) times daily as needed.   [DISCONTINUED] cyclobenzaprine  (FLEXERIL ) 5 MG tablet Take 1 tablet (5 mg total) by mouth 3 (three) times daily as needed. (Patient not taking: Reported on 07/23/2024)   [DISCONTINUED] naproxen  (NAPROSYN ) 500 MG tablet Take 1 tablet (500 mg total) by mouth 2 (two) times daily with a meal. (Patient not taking: Reported on 07/23/2024)   No facility-administered encounter medications on file as of 07/23/2024.    Follow-up: Return in about 2 months (around 09/22/2024) for CPE.   Jaiel Saraceno R Donterrius Santucci, FNP

## 2024-07-23 NOTE — Patient Instructions (Addendum)
 1. Chronic bilateral low back pain without sciatica (Primary)  - ibuprofen (ADVIL) 600 MG tablet; Take 1 tablet (600 mg total) by mouth every 8 (eight) hours as needed.  Dispense: 30 tablet; Refill: 0 - cyclobenzaprine  (FLEXERIL ) 5 MG tablet; Take 1 tablet (5 mg total) by mouth 3 (three) times daily as needed.  Dispense: 30 tablet; Refill: 0    Please alternate  ibuprofen with tylenol  650 mg every 6 hours as needed . I encourage use of heating pad and stretching exercises   Please get your x-ray done at [facility] Address: [address] [place], [place], [place] [address] Phone: [phone]   It is important that you exercise regularly at least 30 minutes 5 times a week as tolerated  Think about what you will eat, plan ahead. Choose  clean, green, fresh or frozen over canned, processed or packaged foods which are more sugary, salty and fatty. 70 to 75% of food eaten should be vegetables and fruit. Three meals at set times with snacks allowed between meals, but they must be fruit or vegetables. Aim to eat over a 12 hour period , example 7 am to 7 pm, and STOP after  your last meal of the day. Drink water,generally about 64 ounces per day, no other drink is as healthy. Fruit juice is best enjoyed in a healthy way, by EATING the fruit.  Thanks for choosing [facility] we consider it a privelige to serve you.

## 2024-07-24 ENCOUNTER — Ambulatory Visit: Payer: Self-pay | Admitting: Nurse Practitioner

## 2024-07-24 LAB — CMP14+EGFR
ALT: 19 IU/L (ref 0–32)
AST: 22 IU/L (ref 0–40)
Albumin: 4.3 g/dL (ref 3.9–4.9)
Alkaline Phosphatase: 58 IU/L (ref 41–116)
BUN/Creatinine Ratio: 18 (ref 9–23)
BUN: 15 mg/dL (ref 6–20)
Bilirubin Total: 0.2 mg/dL (ref 0.0–1.2)
CO2: 22 mmol/L (ref 20–29)
Calcium: 9.1 mg/dL (ref 8.7–10.2)
Chloride: 107 mmol/L — ABNORMAL HIGH (ref 96–106)
Creatinine, Ser: 0.82 mg/dL (ref 0.57–1.00)
Globulin, Total: 2.8 g/dL (ref 1.5–4.5)
Glucose: 100 mg/dL — ABNORMAL HIGH (ref 70–99)
Potassium: 4.3 mmol/L (ref 3.5–5.2)
Sodium: 139 mmol/L (ref 134–144)
Total Protein: 7.1 g/dL (ref 6.0–8.5)
eGFR: 97 mL/min/1.73 (ref >59)

## 2024-07-25 ENCOUNTER — Ambulatory Visit: Admitting: Pediatrics

## 2024-09-30 ENCOUNTER — Encounter: Payer: Self-pay | Admitting: Nurse Practitioner

## 2024-09-30 ENCOUNTER — Ambulatory Visit (INDEPENDENT_AMBULATORY_CARE_PROVIDER_SITE_OTHER): Payer: Self-pay | Admitting: Nurse Practitioner

## 2024-09-30 VITALS — BP 136/75 | HR 83 | Temp 97.0°F | Wt 166.0 lb

## 2024-09-30 DIAGNOSIS — M545 Low back pain, unspecified: Secondary | ICD-10-CM | POA: Diagnosis not present

## 2024-09-30 DIAGNOSIS — Z1321 Encounter for screening for nutritional disorder: Secondary | ICD-10-CM | POA: Diagnosis not present

## 2024-09-30 DIAGNOSIS — Z0000 Encounter for general adult medical examination without abnormal findings: Secondary | ICD-10-CM | POA: Diagnosis not present

## 2024-09-30 DIAGNOSIS — Z1329 Encounter for screening for other suspected endocrine disorder: Secondary | ICD-10-CM | POA: Diagnosis not present

## 2024-09-30 DIAGNOSIS — G8929 Other chronic pain: Secondary | ICD-10-CM | POA: Diagnosis not present

## 2024-09-30 DIAGNOSIS — Z13228 Encounter for screening for other metabolic disorders: Secondary | ICD-10-CM

## 2024-09-30 DIAGNOSIS — F1290 Cannabis use, unspecified, uncomplicated: Secondary | ICD-10-CM | POA: Diagnosis not present

## 2024-09-30 DIAGNOSIS — J302 Other seasonal allergic rhinitis: Secondary | ICD-10-CM | POA: Diagnosis not present

## 2024-09-30 DIAGNOSIS — Z130 Encounter for screening for diseases of the blood and blood-forming organs and certain disorders involving the immune mechanism: Secondary | ICD-10-CM | POA: Diagnosis not present

## 2024-09-30 DIAGNOSIS — J209 Acute bronchitis, unspecified: Secondary | ICD-10-CM | POA: Insufficient documentation

## 2024-09-30 MED ORDER — METHYLPREDNISOLONE 4 MG PO TBPK: ORAL_TABLET | ORAL | 0 refills | Status: AC

## 2024-09-30 MED ORDER — LORATADINE 10 MG PO TABS: 10.0000 mg | ORAL_TABLET | Freq: Every day | ORAL | 11 refills | Status: AC

## 2024-09-30 MED ORDER — AZITHROMYCIN 250 MG PO TABS: ORAL_TABLET | ORAL | 0 refills | Status: AC

## 2024-09-30 MED ORDER — CYCLOBENZAPRINE HCL 5 MG PO TABS: 5.0000 mg | ORAL_TABLET | Freq: Three times a day (TID) | ORAL | 0 refills | Status: AC | PRN

## 2024-09-30 MED ORDER — IBUPROFEN 600 MG PO TABS: 600.0000 mg | ORAL_TABLET | Freq: Three times a day (TID) | ORAL | 0 refills | Status: AC | PRN

## 2024-09-30 NOTE — Assessment & Plan Note (Signed)
Claritin 10 mg daily prescribed

## 2024-09-30 NOTE — Assessment & Plan Note (Signed)
" °  Occasional cannabis use reported. Discussed risks and encouraged cessation. - Encouraged cessation of cannabis use. "

## 2024-09-30 NOTE — Patient Instructions (Addendum)
 1. Chronic bilateral low back pain without sciatica - cyclobenzaprine  (FLEXERIL ) 5 MG tablet; Take 1 tablet (5 mg total) by mouth 3 (three) times daily as needed.  Dispense: 30 tablet; Refill: 0 - ibuprofen  (ADVIL ) 600 MG tablet; Take 1 tablet (600 mg total) by mouth every 8 (eight) hours as needed.  Dispense: 30 tablet; Refill: 0  2. Annual physical exam (Primary)  3. Screening for endocrine, nutritional, metabolic and immunity disorder - CBC; Future - Lipid panel; Future - HIV Antibody (routine testing w rflx); Future - Hepatitis C antibody; Future  4. Seasonal allergic rhinitis, unspecified trigger - loratadine  (CLARITIN ) 10 MG tablet; Take 1 tablet (10 mg total) by mouth daily.  Dispense: 30 tablet; Refill: 11  5. Acute bronchitis, unspecified organism - methylPREDNISolone  (MEDROL  DOSEPAK) 4 MG TBPK tablet; Take as instructed on the packaging  Dispense: 1 each; Refill: 0 - azithromycin  (ZITHROMAX ) 250 MG tablet; Take 2 tablets on day 1, then 1 tablet daily on days 2 through 5  Dispense: 6 tablet; Refill: 0 Take prednisone with food.  Do not use Aleve  or ibuprofen  when taking prednisone.  Okay to take Tylenol  650 mg every 6 hours as needed for body aches, fever.  Drink at least 64 ounces of water to maintain hydration   It is important that you exercise regularly at least 30 minutes 5 times a week as tolerated  Think about what you will eat, plan ahead. Choose  clean, green, fresh or frozen over canned, processed or packaged foods which are more sugary, salty and fatty. 70 to 75% of food eaten should be vegetables and fruit. Three meals at set times with snacks allowed between meals, but they must be fruit or vegetables. Aim to eat over a 12 hour period , example 7 am to 7 pm, and STOP after  your last meal of the day. Drink water,generally about 64 ounces per day, no other drink is as healthy. Fruit juice is best enjoyed in a healthy way, by EATING the fruit.  Thanks for choosing  [facility] we consider it a privelige to serve you.

## 2024-09-30 NOTE — Progress Notes (Signed)
 "  Complete physical exam  Patient: Rose Stone   DOB: May 14, 1991   34 y.o. Female  MRN: [id number]  Subjective:    Chief Complaint  Patient presents with   Annual Exam    Vomiting and not eating in past two days. Body is aching. X new years.     He  Discussed the use of AI scribe software for clinical note transcription with the patient, who gave verbal consent to proceed.  History of Present Illness Rose Stone is a 34 year old female who presents for a CPE .  She has been experiencing body aches for six days, affecting her entire body including her head, neck, fingers, and toes. The sensation is described as soreness and aching. She has been using Nyquil and lozenges for relief but has not taken ibuprofen  for the body aches.  She reports nausea and vomiting, with the vomiting occurring yesterday morning. Additionally, she has a cough with light-colored sputum.,  Sneezing, runny nose itchy eyes, itchy throat she has not been around anyone known to be sick with COVID or flu, although she mentions 'the babies have little tightness'.  She has a history of back pain which has improved but still occurs intermittently. She uses a muscle relaxer and ibuprofen  as needed, approximately once a week, and finds heat pads and exercise helpful.  She smokes Black & Mild cigars, about one or two a day, and occasionally uses marijuana. She lives with her partner and son. She works as a airline pilot at [employer], which she describes as a physically intense job. She has been living in [place]  for the past 17 years.  No known allergies. She reports nasal congestion, sneezing, and an itchy throat, which she attributes to recent symptoms rather than seasonal allergies.    Assessment & Plan        Most recent fall risk assessment:     No data to display           Most recent depression screenings:    09/30/2024   11:20 AM 07/23/2024    9:53 AM  PHQ 2/9 Scores   PHQ - 2 Score 0 0  PHQ- 9 Score 0 0      Data saved with a previous flowsheet row definition        Patient Care Team: Kamira Mellette R, FNP as PCP - General (Nurse Practitioner)   Show/hide medication list[1]  Review of Systems  Constitutional:  Positive for appetite change and fatigue. Negative for fever.  HENT:  Positive for congestion, rhinorrhea and sneezing.   Eyes:  Positive for itching. Negative for pain and discharge.  Respiratory:  Positive for cough. Negative for shortness of breath and wheezing.   Cardiovascular:  Negative for chest pain, palpitations and leg swelling.  Gastrointestinal:  Positive for nausea and vomiting. Negative for abdominal pain and constipation.  Endocrine: Negative for polydipsia, polyphagia and polyuria.  Genitourinary:  Negative for difficulty urinating, dysuria, flank pain and frequency.  Musculoskeletal:  Negative for arthralgias, back pain, joint swelling and myalgias.  Skin:  Negative for color change, pallor, rash and wound.  Allergic/Immunologic: Positive for environmental allergies. Negative for immunocompromised state.  Neurological:  Negative for dizziness, facial asymmetry, weakness, numbness and headaches.  Psychiatric/Behavioral:  Negative for behavioral problems, confusion, self-injury and suicidal ideas.        Objective:     BP 136/75 (BP Location: Left Arm, Patient Position: Sitting)   Pulse 83   Temp (!) 97  F (36.1 C) (Temporal)   Wt 166 lb (75.3 kg)   SpO2 100%   BMI 26.79 kg/m    Physical Exam Vitals and nursing note reviewed. Exam conducted with a chaperone present.  Constitutional:      General: She is not in acute distress.    Appearance: Normal appearance. She is not ill-appearing, toxic-appearing or diaphoretic.  HENT:     Right Ear: Tympanic membrane, ear canal and external ear normal. There is no impacted cerumen.     Left Ear: Tympanic membrane, ear canal and external ear normal. There is no  impacted cerumen.     Nose: Congestion and rhinorrhea present.     Mouth/Throat:     Mouth: Mucous membranes are moist.     Pharynx: Oropharynx is clear. No oropharyngeal exudate or posterior oropharyngeal erythema.  Eyes:     General: No scleral icterus.       Right eye: No discharge.        Left eye: No discharge.     Extraocular Movements: Extraocular movements intact.     Conjunctiva/sclera: Conjunctivae normal.  Neck:     Vascular: No carotid bruit.  Cardiovascular:     Rate and Rhythm: Normal rate and regular rhythm.     Pulses: Normal pulses.     Heart sounds: Normal heart sounds. No murmur heard.    No friction rub. No gallop.  Pulmonary:     Effort: Pulmonary effort is normal. No respiratory distress.     Breath sounds: No stridor. Rhonchi present. No wheezing or rales.  Chest:     Chest wall: No tenderness.  Abdominal:     General: Bowel sounds are normal. There is no distension.     Palpations: Abdomen is soft. There is no mass.     Tenderness: There is no abdominal tenderness. There is no right CVA tenderness, left CVA tenderness, guarding or rebound.     Hernia: No hernia is present.  Musculoskeletal:        General: No swelling, tenderness, deformity or signs of injury.     Cervical back: Normal range of motion and neck supple. No rigidity or tenderness.     Right lower leg: No edema.     Left lower leg: No edema.  Lymphadenopathy:     Cervical: No cervical adenopathy.  Skin:    General: Skin is warm and dry.     Capillary Refill: Capillary refill takes less than 2 seconds.     Coloration: Skin is not jaundiced or pale.     Findings: No bruising, erythema, lesion or rash.  Neurological:     Mental Status: She is alert and oriented to person, place, and time.     Cranial Nerves: No cranial nerve deficit.     Motor: No weakness.     Gait: Gait normal.  Psychiatric:        Mood and Affect: Mood normal.        Behavior: Behavior normal.        Thought  Content: Thought content normal.        Judgment: Judgment normal.     No results found for any visits on 09/30/24.     Assessment & Plan:    Routine Health Maintenance and Physical Exam   There is no immunization history on file for this patient.  Health Maintenance  Topic Date Due   HIV Screening  Never done   Hepatitis C Screening  Never done   DTaP/Tdap/Td (1 -  Tdap) Never done   Hepatitis B Vaccines 19-59 Average Risk (1 of 3 - 19+ 3-dose series) Never done   HPV VACCINES (1 - 3-dose SCDM series) Never done   Cervical Cancer Screening (HPV/Pap Cotest)  Never done   Influenza Vaccine  Never done   COVID-19 Vaccine (1 - 2025-26 season) Never done   Pneumococcal Vaccine  Aged Out   Meningococcal B Vaccine  Aged Out    Discussed health benefits of physical activity, and encouraged her to engage in regular exercise appropriate for her age and condition.  Problem List Items Addressed This Visit       Respiratory   Acute bronchitis   Acute bronchitis Symptoms suggest possible bronchitis. No known exposure to sick contacts. - Prescribed antibiotics for potential infection. - Prescribed prednisone for inflammation, advised to take with food and avoid ibuprofen . - Recommended Tylenol  650 mg every 6 hours as needed for body aches while on prednisone.         Relevant Medications   methylPREDNISolone  (MEDROL  DOSEPAK) 4 MG TBPK tablet   azithromycin  (ZITHROMAX ) 250 MG tablet   Seasonal allergic rhinitis   Claritin  10 mg daily prescribed      Relevant Medications   loratadine  (CLARITIN ) 10 MG tablet     Other   Chronic bilateral low back pain without sciatica   Chronic bilateral low back pain without sciatica Intermittent low back pain, currently improved but still present. - Refilled muscle relaxer prescription. - Advised to use ibuprofen  sparingly due to potential kidney damage. - Recommended use of heating pad and exercise for pain management.        Relevant Medications   cyclobenzaprine  (FLEXERIL ) 5 MG tablet   ibuprofen  (ADVIL ) 600 MG tablet   methylPREDNISolone  (MEDROL  DOSEPAK) 4 MG TBPK tablet   Cannabis use disorder    Occasional cannabis use reported. Discussed risks and encouraged cessation. - Encouraged cessation of cannabis use.      Annual physical exam - Primary   Annual exam as documented.  Counseling done include healthy lifestyle involving committing to 150 minutes of exercise per week, heart healthy diet,  Regular use of seat belt and home safety were also discussed . Changes in health habits are decided on by patient with goals and time frames set for achieving them. Immunization and cancer screening  needs are specifically addressed at this visit.    Routine fasting labs ordered, advised to stay up-to-date with Tdap vaccine, HPV vaccine, flu vaccine. Up to date with hep b vaccine      Other Visit Diagnoses       Screening for endocrine, nutritional, metabolic and immunity disorder       Relevant Orders   CBC   Lipid panel   HIV Antibody (routine testing w rflx)   Hepatitis C antibody      Return in about 3 months (around 12/29/2024) for PAP smear, FASTING LABS THIS WEEK.     Dontarius Sheley R Erwin Nishiyama, FNP      [1]  Outpatient Medications Prior to Visit  Medication Sig   [DISCONTINUED] cyclobenzaprine  (FLEXERIL ) 5 MG tablet Take 1 tablet (5 mg total) by mouth 3 (three) times daily as needed.   [DISCONTINUED] ibuprofen  (ADVIL ) 600 MG tablet Take 1 tablet (600 mg total) by mouth every 8 (eight) hours as needed.   No facility-administered medications prior to visit.   "

## 2024-09-30 NOTE — Assessment & Plan Note (Addendum)
 Annual exam as documented.  Counseling done include healthy lifestyle involving committing to 150 minutes of exercise per week, heart healthy diet,  Regular use of seat belt and home safety were also discussed . Changes in health habits are decided on by patient with goals and time frames set for achieving them. Immunization and cancer screening  needs are specifically addressed at this visit.    Routine fasting labs ordered, advised to stay up-to-date with Tdap vaccine, HPV vaccine, flu vaccine. Up to date with hep b vaccine

## 2024-09-30 NOTE — Assessment & Plan Note (Addendum)
 Acute bronchitis Symptoms suggest possible bronchitis. No known exposure to sick contacts. - Prescribed antibiotics for potential infection. - Prescribed prednisone for inflammation, advised to take with food and avoid ibuprofen . - Recommended Tylenol  650 mg every 6 hours as needed for body aches while on prednisone.

## 2024-09-30 NOTE — Assessment & Plan Note (Signed)
 Chronic bilateral low back pain without sciatica Intermittent low back pain, currently improved but still present. - Refilled muscle relaxer prescription. - Advised to use ibuprofen  sparingly due to potential kidney damage. - Recommended use of heating pad and exercise for pain management.

## 2024-12-30 ENCOUNTER — Ambulatory Visit: Payer: Self-pay | Admitting: Nurse Practitioner
